# Patient Record
Sex: Female | Born: 1935 | Race: Black or African American | Smoking: Never smoker
Health system: Southern US, Community
[De-identification: ages and names within clinical notes are randomized; demographics above are authoritative.]

## PROBLEM LIST (undated history)

## (undated) DIAGNOSIS — I10 Essential (primary) hypertension: Secondary | ICD-10-CM

## (undated) DIAGNOSIS — F0390 Unspecified dementia without behavioral disturbance: Secondary | ICD-10-CM

## (undated) DIAGNOSIS — E079 Disorder of thyroid, unspecified: Secondary | ICD-10-CM

## (undated) DIAGNOSIS — E785 Hyperlipidemia, unspecified: Secondary | ICD-10-CM

## (undated) HISTORY — DX: Hyperlipidemia, unspecified: E78.5

## (undated) HISTORY — DX: Essential (primary) hypertension: I10

## (undated) HISTORY — DX: Disorder of thyroid, unspecified: E07.9

## (undated) HISTORY — DX: Unspecified dementia, unspecified severity, without behavioral disturbance, psychotic disturbance, mood disturbance, and anxiety: F03.90

---

## 2012-05-23 ENCOUNTER — Telehealth: Payer: Self-pay | Admitting: *Deleted

## 2012-05-23 ENCOUNTER — Encounter: Payer: Self-pay | Admitting: Family Medicine

## 2012-05-23 ENCOUNTER — Ambulatory Visit (INDEPENDENT_AMBULATORY_CARE_PROVIDER_SITE_OTHER): Payer: Medicare Other | Admitting: Family Medicine

## 2012-05-23 VITALS — BP 130/72 | HR 94 | Temp 98.0°F | Ht 61.5 in | Wt 141.6 lb

## 2012-05-23 DIAGNOSIS — Z1231 Encounter for screening mammogram for malignant neoplasm of breast: Secondary | ICD-10-CM

## 2012-05-23 DIAGNOSIS — E049 Nontoxic goiter, unspecified: Secondary | ICD-10-CM

## 2012-05-23 DIAGNOSIS — G4700 Insomnia, unspecified: Secondary | ICD-10-CM

## 2012-05-23 DIAGNOSIS — I10 Essential (primary) hypertension: Secondary | ICD-10-CM

## 2012-05-23 DIAGNOSIS — E119 Type 2 diabetes mellitus without complications: Secondary | ICD-10-CM

## 2012-05-23 DIAGNOSIS — E785 Hyperlipidemia, unspecified: Secondary | ICD-10-CM | POA: Insufficient documentation

## 2012-05-23 LAB — LIPID PANEL
LDL Cholesterol: 75 mg/dL (ref 0–99)
Total CHOL/HDL Ratio: 3

## 2012-05-23 LAB — CBC WITH DIFFERENTIAL/PLATELET
Basophils Absolute: 0 10*3/uL (ref 0.0–0.1)
Hemoglobin: 13.1 g/dL (ref 12.0–15.0)
Lymphocytes Relative: 30.6 % (ref 12.0–46.0)
Monocytes Relative: 7.7 % (ref 3.0–12.0)
Neutro Abs: 3.9 10*3/uL (ref 1.4–7.7)
Platelets: 240 10*3/uL (ref 150.0–400.0)
RDW: 13.4 % (ref 11.5–14.6)

## 2012-05-23 LAB — HEPATIC FUNCTION PANEL
ALT: 15 U/L (ref 0–35)
AST: 23 U/L (ref 0–37)
Alkaline Phosphatase: 57 U/L (ref 39–117)
Bilirubin, Direct: 0 mg/dL (ref 0.0–0.3)
Total Protein: 7.4 g/dL (ref 6.0–8.3)

## 2012-05-23 LAB — BASIC METABOLIC PANEL
CO2: 28 mEq/L (ref 19–32)
Chloride: 106 mEq/L (ref 96–112)
Sodium: 142 mEq/L (ref 135–145)

## 2012-05-23 MED ORDER — CETIRIZINE HCL 10 MG PO TABS: 10.0000 mg | ORAL_TABLET | Freq: Every day | ORAL | Status: AC | PRN

## 2012-05-23 MED ORDER — AMITRIPTYLINE HCL 50 MG PO TABS: 50.0000 mg | ORAL_TABLET | Freq: Every day | ORAL | Status: DC

## 2012-05-23 MED ORDER — TRAMADOL HCL 50 MG PO TABS: 50.0000 mg | ORAL_TABLET | Freq: Four times a day (QID) | ORAL | Status: DC | PRN

## 2012-05-23 MED ORDER — POLYETHYLENE GLYCOL 3350 17 G PO PACK: 17.0000 g | PACK | Freq: Every day | ORAL | Status: AC | PRN

## 2012-05-23 MED ORDER — HYDROCHLOROTHIAZIDE 25 MG PO TABS: 25.0000 mg | ORAL_TABLET | Freq: Every day | ORAL | Status: AC

## 2012-05-23 MED ORDER — METFORMIN HCL ER 500 MG PO TB24: 500.0000 mg | ORAL_TABLET | Freq: Two times a day (BID) | ORAL | Status: DC

## 2012-05-23 MED ORDER — LISINOPRIL 40 MG PO TABS: 40.0000 mg | ORAL_TABLET | Freq: Every day | ORAL | Status: AC

## 2012-05-23 MED ORDER — ZOLPIDEM TARTRATE ER 6.25 MG PO TBCR: 6.2500 mg | EXTENDED_RELEASE_TABLET | Freq: Every evening | ORAL | Status: DC | PRN

## 2012-05-23 MED ORDER — ATORVASTATIN CALCIUM 40 MG PO TABS: 40.0000 mg | ORAL_TABLET | Freq: Every day | ORAL | Status: DC

## 2012-05-23 NOTE — Patient Instructions (Signed)
Cancel your appt for later this month and schedule a follow up in 3 months to recheck diabetes We'll notify you of your lab results and make any changes if needed Someone will call you with your thyroid ultrasound and mammogram appts Call with any questions or concerns Welcome!  We're glad to have you!

## 2012-05-23 NOTE — Telephone Encounter (Signed)
Called pt to advise we did receive her medical records from her previous MD however she may need to still bring medications to make sure there is no lapse or changes between offices, pt advised they will bring medications

## 2012-05-23 NOTE — Assessment & Plan Note (Signed)
New to provider.  Chronic for pt.  Adequate control on current meds.  Asymptomatic.  No changes.

## 2012-05-23 NOTE — Assessment & Plan Note (Signed)
New to provider.  Well controlled based on labs done 3 months ago.  Recently had eye exam.  Stressed importance of yearly visits.  Denies sxs.  Will check labs and adjust meds prn.

## 2012-05-23 NOTE — Assessment & Plan Note (Signed)
New to provider.  Chronic for pt.  Was recently switched to lipitor from Zocor.  Check labs to see if LDL has improved.

## 2012-05-23 NOTE — Assessment & Plan Note (Signed)
New to provider.  Chronic for pt.  Pt reports meds are working well 'when i get them'.  Provided refills.

## 2012-05-23 NOTE — Progress Notes (Signed)
  Subjective:    Patient ID: Amber Hood, female    DOB: 11-06-1927, 78 y.o.   MRN: [id number]  HPI New to establish.  Previous MD- [facility]  DM- chronic problem, last A1C was 6.5 according to May labs.  On Metformin XR BID.  UTD on eye exam (June).  Denies symptomatic lows.  No CP, SOB, HAs, visual changes, N/V/D. + edema- was previously on fluid pill but has not been able to find them.  No weakness or numbness of hands or feet.  Hyperlipidemia- chronic problem, recently changed from simvastatin to atorvastatin in May due to LDL of 143.  Due for repeat labs.  No abd pain, N/V.  HTN- chronic problem, asymptomatic w/ exception of LE edema.  Insomnia- chronic problem for pt, taking Ambien 6.25mg  and amitriptyline whenever daughter remembers to give pt her meds.  Breast pain- R sided, mammo was ordered by previous MD in July but pt never made appt.  No breast discharge, no skin changes, no lumps.   Review of Systems For ROS see HPI     Objective:   Physical Exam  Constitutional: She is oriented to person, place, and time. She appears well-developed and well-nourished. No distress.  HENT:  Head: Normocephalic and atraumatic.  Eyes: Conjunctivae and EOM are normal. Pupils are equal, round, and reactive to light.  Neck: Normal range of motion. Neck supple. Thyromegaly (large and very firm) present.  Cardiovascular: Normal rate, regular rhythm and intact distal pulses.   Murmur (II/VI SEM heard throughout) heard. Pulmonary/Chest: Effort normal and breath sounds normal. No respiratory distress. Right breast exhibits no inverted nipple, no mass, no nipple discharge, no skin change and no tenderness. Left breast exhibits no inverted nipple, no mass, no nipple discharge, no skin change and no tenderness.  Abdominal: Soft. She exhibits no distension. There is no tenderness.  Musculoskeletal: She exhibits edema (trace edema of B LE).  Lymphadenopathy:    She has no cervical  adenopathy.  Neurological: She is alert and oriented to person, place, and time.  Skin: Skin is warm and dry.  Psychiatric: She has a normal mood and affect. Her behavior is normal.          Assessment & Plan:

## 2012-05-23 NOTE — Assessment & Plan Note (Signed)
New to provider.  Pt reports 'no one's looked at that in years'.  Will get [place] to assess as thyroid is very large and firm.

## 2012-05-24 ENCOUNTER — Encounter: Payer: Self-pay | Admitting: *Deleted

## 2012-05-26 ENCOUNTER — Other Ambulatory Visit: Payer: Medicaid Other

## 2012-05-27 ENCOUNTER — Ambulatory Visit
Admission: RE | Admit: 2012-05-27 | Discharge: 2012-05-27 | Disposition: A | Discharge status: Home / Self Care | Payer: Medicare Other | Source: Ambulatory Visit | Attending: Family Medicine | Admitting: Family Medicine

## 2012-05-27 ENCOUNTER — Telehealth: Payer: Self-pay | Admitting: *Deleted

## 2012-05-27 DIAGNOSIS — E049 Nontoxic goiter, unspecified: Secondary | ICD-10-CM

## 2012-05-27 NOTE — Telephone Encounter (Signed)
Pt daughter would like for Dr Beverely Low to review Pt records to see when Pt was Dx with dementia a specific date is needed. Pt daughter notes that this is not a urgent matter but would like to know because she is trying to get some assistant for her mom and this info is being requested.

## 2012-05-30 NOTE — Telephone Encounter (Signed)
We have dementia listed on pt's problem list from previous MD but no date of onset in the 8 pages of records I have available for review.

## 2012-05-31 ENCOUNTER — Telehealth: Payer: Self-pay | Admitting: *Deleted

## 2012-05-31 DIAGNOSIS — E010 Iodine-deficiency related diffuse (endemic) goiter: Secondary | ICD-10-CM

## 2012-05-31 NOTE — Telephone Encounter (Signed)
Discuss with patient daughter 

## 2012-05-31 NOTE — Telephone Encounter (Signed)
.  left message to have patient return my call per noted recent imaging notations made by MD Tabori as follows:  Please refer to ENT for thyroid biopsy (dx thyromegaly) Placed referral in chart will alert pt with next steps upon return of call.

## 2012-05-31 NOTE — Telephone Encounter (Signed)
Left message to call office

## 2012-05-31 NOTE — Telephone Encounter (Signed)
Pt return call,Left message to call office.  

## 2012-06-01 DIAGNOSIS — F0390 Unspecified dementia without behavioral disturbance: Secondary | ICD-10-CM

## 2012-06-01 DIAGNOSIS — M79609 Pain in unspecified limb: Secondary | ICD-10-CM

## 2012-06-01 DIAGNOSIS — E039 Hypothyroidism, unspecified: Secondary | ICD-10-CM

## 2012-06-01 DIAGNOSIS — R5383 Other fatigue: Secondary | ICD-10-CM

## 2012-06-01 DIAGNOSIS — M545 Low back pain: Secondary | ICD-10-CM

## 2012-06-01 DIAGNOSIS — R32 Unspecified urinary incontinence: Secondary | ICD-10-CM

## 2012-06-01 DIAGNOSIS — R5381 Other malaise: Secondary | ICD-10-CM

## 2012-06-01 DIAGNOSIS — Z0000 Encounter for general adult medical examination without abnormal findings: Secondary | ICD-10-CM

## 2012-06-07 NOTE — Telephone Encounter (Signed)
Spoke to pt to advise results/instructions. Pt understood, with pt DPR daughter, notes apt has been scheduled for referral and will keep that apt.

## 2012-06-09 ENCOUNTER — Ambulatory Visit: Payer: Self-pay | Admitting: Family Medicine

## 2012-06-22 ENCOUNTER — Telehealth: Payer: Self-pay | Admitting: Family Medicine

## 2012-06-22 NOTE — Telephone Encounter (Signed)
Caller: Phyllis/Other; Patient Name: Amber Hood; PCP: Sheliah Hatch.; Best Callback Phone Number: [phone].  Reports patient was found on concrete, assumed she fell. It was not witnessed, and family took her to ED at [facility]. No fractures reported.   They were instructed to follow up with Dr. Beverely Low within the next 2 weeks.   No 30 minute appointments available in next 24 hours.  Caller requests appointment  @ 15:00 or later due to her work needs.  Information noted and sent to office for review and scheduling. Caller informed.  Caller also relates that she needs a letter regarding her mom's competency and if she has dementia, with a date that dementia was noted - whether it was Dr. Beverely Low or previous provider. States she has previously called regarding this.

## 2012-06-22 NOTE — Telephone Encounter (Signed)
Noted  

## 2012-06-22 NOTE — Telephone Encounter (Signed)
Called number listed it is for [facility] Ruff-daughter ABM LM to call and set up 30-minute ov with in 2-wks per notes below.

## 2012-06-22 NOTE — Telephone Encounter (Signed)
Coming in 9.25.13 wed at 11am

## 2012-06-22 NOTE — Telephone Encounter (Signed)
Does not need appt w/in 24 hrs- instructions were listed as 2 weeks.  Please schedule hospital f/u.  Amber Hood already discussed dementia dx and lack of date of onset w/ pt's daughter (see phone note 8/16)

## 2012-07-06 ENCOUNTER — Ambulatory Visit: Payer: Medicare Other | Admitting: Family Medicine

## 2012-07-15 ENCOUNTER — Ambulatory Visit (HOSPITAL_BASED_OUTPATIENT_CLINIC_OR_DEPARTMENT_OTHER): Payer: Medicare Other

## 2012-07-18 ENCOUNTER — Ambulatory Visit (HOSPITAL_BASED_OUTPATIENT_CLINIC_OR_DEPARTMENT_OTHER): Payer: Medicare Other

## 2012-07-20 ENCOUNTER — Ambulatory Visit (HOSPITAL_BASED_OUTPATIENT_CLINIC_OR_DEPARTMENT_OTHER): Payer: Medicare Other

## 2012-07-27 ENCOUNTER — Ambulatory Visit (HOSPITAL_BASED_OUTPATIENT_CLINIC_OR_DEPARTMENT_OTHER)
Admission: RE | Admit: 2012-07-27 | Discharge: 2012-07-27 | Disposition: A | Discharge status: Home / Self Care | Payer: Medicare Other | Source: Ambulatory Visit | Attending: Family Medicine | Admitting: Family Medicine

## 2012-07-27 DIAGNOSIS — Z1231 Encounter for screening mammogram for malignant neoplasm of breast: Secondary | ICD-10-CM

## 2012-08-25 ENCOUNTER — Ambulatory Visit: Payer: Self-pay | Admitting: Family Medicine

## 2012-09-06 ENCOUNTER — Ambulatory Visit: Payer: Self-pay | Admitting: Family Medicine

## 2012-09-14 ENCOUNTER — Ambulatory Visit (INDEPENDENT_AMBULATORY_CARE_PROVIDER_SITE_OTHER): Payer: Medicare Other | Admitting: Family Medicine

## 2012-09-14 ENCOUNTER — Encounter: Payer: Self-pay | Admitting: Family Medicine

## 2012-09-14 VITALS — BP 130/90 | HR 97 | Temp 98.1°F | Ht 61.5 in | Wt 129.8 lb

## 2012-09-14 DIAGNOSIS — E119 Type 2 diabetes mellitus without complications: Secondary | ICD-10-CM

## 2012-09-14 DIAGNOSIS — E049 Nontoxic goiter, unspecified: Secondary | ICD-10-CM

## 2012-09-14 DIAGNOSIS — I10 Essential (primary) hypertension: Secondary | ICD-10-CM

## 2012-09-14 NOTE — Progress Notes (Signed)
  Subjective:    Patient ID: Amber Hood, female    DOB: [birth year], 78 y.o.   MRN: [id number]  HPI DM- chronic problem, not currently checking sugars per pt's report but caregiver reports daughter is checking them.  No log to review.  Pt is taking Metformin- daughter administers this.  Denies symptomatic lows- no dizziness or shaky feeling.  No CP, SOB, HAs, visual changes, edema.  Pt doesn't recall having eye exam this year.  HTN- chronic problem, on HCTZ and lisinoprli daily.  Asymptomatic.    Thyroid nodules- pt had US done that showed multiple nodules and last phone note indicates that pt had ENT appt.  Pt unable to report whether she went.  ** difficult hx to obtain due to dementia **   Review of Systems For ROS see HPI     Objective:   Physical Exam  Vitals reviewed. Constitutional: She appears well-developed and well-nourished. No distress.  HENT:  Head: Normocephalic and atraumatic.  Eyes: Conjunctivae normal and EOM are normal. Pupils are equal, round, and reactive to light.  Neck: Normal range of motion. Neck supple. Thyromegaly (large nodules present) present.  Cardiovascular: Normal rate, regular rhythm, normal heart sounds and intact distal pulses.   Pulmonary/Chest: Effort normal and breath sounds normal. No respiratory distress.  Abdominal: Soft. She exhibits no distension. There is no tenderness.  Musculoskeletal: She exhibits no edema.  Lymphadenopathy:    She has no cervical adenopathy.  Neurological: She is alert.       Oriented to person only  Skin: Skin is warm and dry.  Psychiatric: She has a normal mood and affect.          Assessment & Plan:

## 2012-09-14 NOTE — Patient Instructions (Addendum)
Follow up in 3 months to check sugar and cholesterol Please call and let me know if you saw the ENT doctor about your thyroid We'll notify you of your lab results and make any changes if needed Please schedule a diabetic eye exam Call with any questions or concerns Happy Holidays!

## 2012-09-15 LAB — BASIC METABOLIC PANEL
BUN: 16 mg/dL (ref 6–23)
CO2: 31 mEq/L (ref 19–32)
Calcium: 10.3 mg/dL (ref 8.4–10.5)
GFR: 76.06 mL/min (ref >60.00)
Glucose, Bld: 82 mg/dL (ref 70–99)
Sodium: 143 mEq/L (ref 135–145)

## 2012-09-16 ENCOUNTER — Ambulatory Visit: Payer: Medicare Other

## 2012-09-16 DIAGNOSIS — E049 Nontoxic goiter, unspecified: Secondary | ICD-10-CM

## 2012-09-16 LAB — T4, FREE: Free T4: 0.94 ng/dL (ref 0.60–1.60)

## 2012-09-22 ENCOUNTER — Telehealth: Payer: Self-pay | Admitting: Family Medicine

## 2012-09-22 NOTE — Telephone Encounter (Signed)
CALLED pr DAUGHTER LMOM to call & schedule labs only that fits her schedule

## 2012-09-22 NOTE — Telephone Encounter (Signed)
Message copied by Verner Chol on Thu Sep 22, 2012 10:11 AM ------      Message from: Verdie Shire      Created: Tue Sep 20, 2012  5:18 PM       Schedule 3 mos visit for labs.T3/T4 TSH. 240.9

## 2012-10-09 NOTE — Assessment & Plan Note (Signed)
Chronic problem, pt not able to reconcile meds or relay whether she has had eye exam.  Care giver reports daughter checks CBGs and administers meds.  Daughter not at appt today.  Will check labs and if med adjustments need to be made will 1st need to contact daughter to determine current regimen.

## 2012-10-09 NOTE — Assessment & Plan Note (Signed)
Chronic problem.  Adequate control.  Asymptomatic per pt report.  Check labs.  No anticipated med adjustments

## 2012-10-09 NOTE — Assessment & Plan Note (Signed)
Chronic problem.  Unclear if pt ever had ENT appt as directed at last visit.  Will attempt to contact daughter to determine where pt is in w/u.

## 2012-10-11 ENCOUNTER — Telehealth: Payer: Self-pay | Admitting: Family Medicine

## 2012-10-11 NOTE — Telephone Encounter (Signed)
Patient's daughter states the pt is out of amitriptyline and they have contacted the pharmacy twice. We have received nothing from them. Pt uses [facility] in [place].

## 2012-10-11 NOTE — Telephone Encounter (Signed)
Patient daughter requesting refill for amitriptyline. Last OV 09/12/12 last refill 05/23/12. Please advise.

## 2012-10-12 NOTE — Telephone Encounter (Signed)
Ok for #30, 3 refills 

## 2012-10-13 MED ORDER — AMITRIPTYLINE HCL 50 MG PO TABS: 50.0000 mg | ORAL_TABLET | Freq: Every day | ORAL | Status: DC

## 2012-10-13 NOTE — Telephone Encounter (Signed)
Rx sent to the pharmacy by e-script.//AB/CMA 

## 2015-06-06 ENCOUNTER — Inpatient Hospital Stay (HOSPITAL_COMMUNITY): Payer: Medicare (Managed Care)

## 2015-06-06 ENCOUNTER — Inpatient Hospital Stay (HOSPITAL_COMMUNITY): Payer: Medicare (Managed Care) | Admitting: Anesthesiology

## 2015-06-06 ENCOUNTER — Encounter (HOSPITAL_COMMUNITY): Admission: EM | Disposition: E | Payer: Self-pay | Source: Other Acute Inpatient Hospital | Attending: Vascular Surgery

## 2015-06-06 ENCOUNTER — Inpatient Hospital Stay (HOSPITAL_COMMUNITY)
Admission: EM | Admit: 2015-06-06 | Discharge: 2015-07-13 | DRG: 356 | Disposition: E | Payer: Medicare (Managed Care) | Source: Other Acute Inpatient Hospital | Attending: Vascular Surgery | Admitting: Vascular Surgery

## 2015-06-06 ENCOUNTER — Encounter (HOSPITAL_COMMUNITY): Payer: Self-pay | Admitting: *Deleted

## 2015-06-06 DIAGNOSIS — J189 Pneumonia, unspecified organism: Secondary | ICD-10-CM | POA: Insufficient documentation

## 2015-06-06 DIAGNOSIS — A4152 Sepsis due to Pseudomonas: Secondary | ICD-10-CM | POA: Diagnosis not present

## 2015-06-06 DIAGNOSIS — E11649 Type 2 diabetes mellitus with hypoglycemia without coma: Secondary | ICD-10-CM | POA: Diagnosis present

## 2015-06-06 DIAGNOSIS — R451 Restlessness and agitation: Secondary | ICD-10-CM | POA: Diagnosis not present

## 2015-06-06 DIAGNOSIS — I9581 Postprocedural hypotension: Secondary | ICD-10-CM | POA: Diagnosis not present

## 2015-06-06 DIAGNOSIS — I482 Chronic atrial fibrillation: Secondary | ICD-10-CM | POA: Diagnosis present

## 2015-06-06 DIAGNOSIS — Z9911 Dependence on respirator [ventilator] status: Secondary | ICD-10-CM | POA: Insufficient documentation

## 2015-06-06 DIAGNOSIS — Z9889 Other specified postprocedural states: Secondary | ICD-10-CM

## 2015-06-06 DIAGNOSIS — R296 Repeated falls: Secondary | ICD-10-CM | POA: Diagnosis present

## 2015-06-06 DIAGNOSIS — J9819 Other pulmonary collapse: Secondary | ICD-10-CM | POA: Diagnosis not present

## 2015-06-06 DIAGNOSIS — Z515 Encounter for palliative care: Secondary | ICD-10-CM | POA: Diagnosis not present

## 2015-06-06 DIAGNOSIS — G8918 Other acute postprocedural pain: Secondary | ICD-10-CM | POA: Diagnosis not present

## 2015-06-06 DIAGNOSIS — R7881 Bacteremia: Secondary | ICD-10-CM | POA: Diagnosis not present

## 2015-06-06 DIAGNOSIS — D696 Thrombocytopenia, unspecified: Secondary | ICD-10-CM | POA: Diagnosis not present

## 2015-06-06 DIAGNOSIS — I10 Essential (primary) hypertension: Secondary | ICD-10-CM | POA: Diagnosis present

## 2015-06-06 DIAGNOSIS — I350 Nonrheumatic aortic (valve) stenosis: Secondary | ICD-10-CM | POA: Diagnosis present

## 2015-06-06 DIAGNOSIS — Z419 Encounter for procedure for purposes other than remedying health state, unspecified: Secondary | ICD-10-CM

## 2015-06-06 DIAGNOSIS — R0902 Hypoxemia: Secondary | ICD-10-CM | POA: Diagnosis not present

## 2015-06-06 DIAGNOSIS — T17890A Other foreign object in other parts of respiratory tract causing asphyxiation, initial encounter: Secondary | ICD-10-CM | POA: Diagnosis not present

## 2015-06-06 DIAGNOSIS — K55059 Acute (reversible) ischemia of intestine, part and extent unspecified: Secondary | ICD-10-CM | POA: Diagnosis present

## 2015-06-06 DIAGNOSIS — Z9289 Personal history of other medical treatment: Secondary | ICD-10-CM

## 2015-06-06 DIAGNOSIS — I342 Nonrheumatic mitral (valve) stenosis: Secondary | ICD-10-CM | POA: Diagnosis present

## 2015-06-06 DIAGNOSIS — Z79899 Other long term (current) drug therapy: Secondary | ICD-10-CM | POA: Diagnosis not present

## 2015-06-06 DIAGNOSIS — Z7982 Long term (current) use of aspirin: Secondary | ICD-10-CM

## 2015-06-06 DIAGNOSIS — J95821 Acute postprocedural respiratory failure: Secondary | ICD-10-CM | POA: Diagnosis not present

## 2015-06-06 DIAGNOSIS — J9690 Respiratory failure, unspecified, unspecified whether with hypoxia or hypercapnia: Secondary | ICD-10-CM | POA: Insufficient documentation

## 2015-06-06 DIAGNOSIS — K559 Vascular disorder of intestine, unspecified: Secondary | ICD-10-CM

## 2015-06-06 DIAGNOSIS — G9340 Encephalopathy, unspecified: Secondary | ICD-10-CM

## 2015-06-06 DIAGNOSIS — D62 Acute posthemorrhagic anemia: Secondary | ICD-10-CM | POA: Diagnosis not present

## 2015-06-06 DIAGNOSIS — R579 Shock, unspecified: Secondary | ICD-10-CM | POA: Insufficient documentation

## 2015-06-06 DIAGNOSIS — I4891 Unspecified atrial fibrillation: Secondary | ICD-10-CM | POA: Insufficient documentation

## 2015-06-06 DIAGNOSIS — Z01811 Encounter for preprocedural respiratory examination: Secondary | ICD-10-CM

## 2015-06-06 DIAGNOSIS — F0390 Unspecified dementia without behavioral disturbance: Secondary | ICD-10-CM | POA: Diagnosis present

## 2015-06-06 DIAGNOSIS — Z66 Do not resuscitate: Secondary | ICD-10-CM | POA: Diagnosis not present

## 2015-06-06 DIAGNOSIS — E785 Hyperlipidemia, unspecified: Secondary | ICD-10-CM | POA: Diagnosis present

## 2015-06-06 DIAGNOSIS — Z4659 Encounter for fitting and adjustment of other gastrointestinal appliance and device: Secondary | ICD-10-CM

## 2015-06-06 DIAGNOSIS — I5031 Acute diastolic (congestive) heart failure: Secondary | ICD-10-CM | POA: Diagnosis not present

## 2015-06-06 DIAGNOSIS — Z01810 Encounter for preprocedural cardiovascular examination: Secondary | ICD-10-CM | POA: Diagnosis not present

## 2015-06-06 DIAGNOSIS — R578 Other shock: Secondary | ICD-10-CM | POA: Diagnosis not present

## 2015-06-06 DIAGNOSIS — E876 Hypokalemia: Secondary | ICD-10-CM | POA: Diagnosis not present

## 2015-06-06 DIAGNOSIS — A047 Enterocolitis due to Clostridium difficile: Secondary | ICD-10-CM | POA: Diagnosis not present

## 2015-06-06 DIAGNOSIS — B965 Pseudomonas (aeruginosa) (mallei) (pseudomallei) as the cause of diseases classified elsewhere: Secondary | ICD-10-CM | POA: Diagnosis not present

## 2015-06-06 DIAGNOSIS — K550 Acute vascular disorders of intestine: Principal | ICD-10-CM

## 2015-06-06 DIAGNOSIS — S301XXA Contusion of abdominal wall, initial encounter: Secondary | ICD-10-CM | POA: Diagnosis not present

## 2015-06-06 DIAGNOSIS — J9600 Acute respiratory failure, unspecified whether with hypoxia or hypercapnia: Secondary | ICD-10-CM | POA: Diagnosis not present

## 2015-06-06 DIAGNOSIS — N390 Urinary tract infection, site not specified: Secondary | ICD-10-CM | POA: Diagnosis not present

## 2015-06-06 HISTORY — PX: EMBOLECTOMY: SHX44

## 2015-06-06 LAB — COMPREHENSIVE METABOLIC PANEL
ALK PHOS: 46 U/L (ref 38–126)
ALT: 12 U/L — AB (ref 14–54)
AST: 40 U/L (ref 15–41)
Albumin: 2.3 g/dL — ABNORMAL LOW (ref 3.5–5.0)
Anion gap: 10 (ref 5–15)
BUN: 14 mg/dL (ref 6–20)
CALCIUM: 7.7 mg/dL — AB (ref 8.9–10.3)
CHLORIDE: 109 mmol/L (ref 101–111)
CO2: 19 mmol/L — ABNORMAL LOW (ref 22–32)
CREATININE: 0.75 mg/dL (ref 0.44–1.00)
GFR calc Af Amer: >60 mL/min (ref >60)
Glucose, Bld: 242 mg/dL — ABNORMAL HIGH (ref 65–99)
Potassium: 3.3 mmol/L — ABNORMAL LOW (ref 3.5–5.1)
Sodium: 138 mmol/L (ref 135–145)
Total Bilirubin: 0.9 mg/dL (ref 0.3–1.2)
Total Protein: 4.8 g/dL — ABNORMAL LOW (ref 6.5–8.1)

## 2015-06-06 LAB — POCT I-STAT 3, ART BLOOD GAS (G3+)
ACID-BASE DEFICIT: 7 mmol/L — AB (ref 0.0–2.0)
Acid-base deficit: 6 mmol/L — ABNORMAL HIGH (ref 0.0–2.0)
Acid-base deficit: 7 mmol/L — ABNORMAL HIGH (ref 0.0–2.0)
BICARBONATE: 19 meq/L — AB (ref 20.0–24.0)
BICARBONATE: 18.4 meq/L — AB (ref 20.0–24.0)
Bicarbonate: 17.8 mEq/L — ABNORMAL LOW (ref 20.0–24.0)
O2 SAT: 100 %
O2 SAT: 98 %
O2 Saturation: 96 %
PCO2 ART: 33.3 mmHg — AB (ref 35.0–45.0)
PCO2 ART: 34.2 mmHg — AB (ref 35.0–45.0)
PH ART: 7.369 (ref 7.350–7.450)
PH ART: 7.334 — AB (ref 7.350–7.450)
PH ART: 7.336 — AB (ref 7.350–7.450)
PO2 ART: 118 mmHg — AB (ref 80.0–100.0)
Patient temperature: 97.7
TCO2: 20 mmol/L (ref 0–100)
TCO2: 19 mmol/L (ref 0–100)
TCO2: 19 mmol/L (ref 0–100)
pCO2 arterial: 32.1 mmHg — ABNORMAL LOW (ref 35.0–45.0)
pO2, Arterial: 75 mmHg — ABNORMAL LOW (ref 80.0–100.0)
pO2, Arterial: 181 mmHg — ABNORMAL HIGH (ref 80.0–100.0)

## 2015-06-06 LAB — CBC
HCT: 34.5 % — ABNORMAL LOW (ref 36.0–46.0)
HEMATOCRIT: 34.4 % — AB (ref 36.0–46.0)
HEMOGLOBIN: 11.6 g/dL — AB (ref 12.0–15.0)
Hemoglobin: 11.5 g/dL — ABNORMAL LOW (ref 12.0–15.0)
MCH: 29.9 pg (ref 26.0–34.0)
MCH: 30.6 pg (ref 26.0–34.0)
MCHC: 33.3 g/dL (ref 30.0–36.0)
MCHC: 33.7 g/dL (ref 30.0–36.0)
MCV: 89.8 fL (ref 78.0–100.0)
MCV: 90.8 fL (ref 78.0–100.0)
PLATELETS: 211 10*3/uL (ref 150–400)
Platelets: 196 10*3/uL (ref 150–400)
RBC: 3.84 MIL/uL — AB (ref 3.87–5.11)
RBC: 3.79 MIL/uL — AB (ref 3.87–5.11)
RDW: 12.9 % (ref 11.5–15.5)
RDW: 13.2 % (ref 11.5–15.5)
WBC: 19.7 10*3/uL — ABNORMAL HIGH (ref 4.0–10.5)
WBC: 16.5 10*3/uL — AB (ref 4.0–10.5)

## 2015-06-06 LAB — GLUCOSE, CAPILLARY
GLUCOSE-CAPILLARY: 165 mg/dL — AB (ref 65–99)
GLUCOSE-CAPILLARY: 176 mg/dL — AB (ref 65–99)
GLUCOSE-CAPILLARY: 258 mg/dL — AB (ref 65–99)
Glucose-Capillary: 266 mg/dL — ABNORMAL HIGH (ref 65–99)
Glucose-Capillary: 200 mg/dL — ABNORMAL HIGH (ref 65–99)

## 2015-06-06 LAB — TROPONIN I: TROPONIN I: 0.07 ng/mL — AB (ref <0.031)

## 2015-06-06 LAB — PROTIME-INR
INR: 1.37 (ref 0.00–1.49)
PROTHROMBIN TIME: 17 s — AB (ref 11.6–15.2)

## 2015-06-06 LAB — MRSA PCR SCREENING: MRSA by PCR: NEGATIVE

## 2015-06-06 LAB — AMYLASE: AMYLASE: 83 U/L (ref 28–100)

## 2015-06-06 LAB — LACTIC ACID, PLASMA: LACTIC ACID, VENOUS: 3 mmol/L — AB (ref 0.5–2.0)

## 2015-06-06 LAB — ABO/RH: ABO/RH(D): O NEG

## 2015-06-06 LAB — APTT: aPTT: 29 seconds (ref 24–37)

## 2015-06-06 LAB — MAGNESIUM: MAGNESIUM: 1.1 mg/dL — AB (ref 1.7–2.4)

## 2015-06-06 LAB — TRIGLYCERIDES: Triglycerides: 46 mg/dL (ref <150)

## 2015-06-06 SURGERY — EMBOLECTOMY
Anesthesia: General | Site: Abdomen

## 2015-06-06 MED ORDER — GUAIFENESIN-DM 100-10 MG/5ML PO SYRP: 15.0000 mL | ORAL_SOLUTION | ORAL | Status: DC | PRN

## 2015-06-06 MED ORDER — PROTAMINE SULFATE 10 MG/ML IV SOLN
INTRAVENOUS | Status: DC | PRN
  Administered 2015-06-06: 50 mg via INTRAVENOUS

## 2015-06-06 MED ORDER — PROPOFOL 10 MG/ML IV BOLUS
INTRAVENOUS | Status: AC
  Filled 2015-06-06: qty 20

## 2015-06-06 MED ORDER — HEPARIN SODIUM (PORCINE) 1000 UNIT/ML IJ SOLN
INTRAMUSCULAR | Status: DC | PRN
  Administered 2015-06-06: 6000 [IU] via INTRAVENOUS

## 2015-06-06 MED ORDER — MAGNESIUM SULFATE 4 GM/100ML IV SOLN
4.0000 g | Freq: Once | INTRAVENOUS | Status: AC
  Administered 2015-06-06: 4 g via INTRAVENOUS
  Filled 2015-06-06: qty 100

## 2015-06-06 MED ORDER — ANTISEPTIC ORAL RINSE SOLUTION (CORINZ): 7.0000 mL | Freq: Four times a day (QID) | OROMUCOSAL | Status: DC

## 2015-06-06 MED ORDER — METOPROLOL TARTRATE 1 MG/ML IV SOLN
5.0000 mg | INTRAVENOUS | Status: DC | PRN
  Administered 2015-06-06 – 2015-06-16 (×10): 5 mg via INTRAVENOUS
  Filled 2015-06-06 (×11): qty 5

## 2015-06-06 MED ORDER — DEXTROSE 5 % IV SOLN
1.5000 g | Freq: Two times a day (BID) | INTRAVENOUS | Status: AC
  Administered 2015-06-06 – 2015-06-07 (×2): 1.5 g via INTRAVENOUS
  Filled 2015-06-06 (×2): qty 1.5

## 2015-06-06 MED ORDER — OXYCODONE HCL 5 MG PO TABS: 5.0000 mg | ORAL_TABLET | Freq: Once | ORAL | Status: DC | PRN

## 2015-06-06 MED ORDER — INSULIN ASPART 100 UNIT/ML ~~LOC~~ SOLN
0.0000 [IU] | SUBCUTANEOUS | Status: DC
  Administered 2015-06-06: 3 [IU] via SUBCUTANEOUS
  Administered 2015-06-06: 8 [IU] via SUBCUTANEOUS
  Administered 2015-06-06: 3 [IU] via SUBCUTANEOUS
  Administered 2015-06-06: 8 [IU] via SUBCUTANEOUS
  Administered 2015-06-07 (×2): 2 [IU] via SUBCUTANEOUS
  Administered 2015-06-07: 3 [IU] via SUBCUTANEOUS
  Administered 2015-06-08 – 2015-06-13 (×15): 2 [IU] via SUBCUTANEOUS
  Administered 2015-06-13: 3 [IU] via SUBCUTANEOUS
  Administered 2015-06-14: 2 [IU] via SUBCUTANEOUS
  Administered 2015-06-14 (×3): 3 [IU] via SUBCUTANEOUS
  Administered 2015-06-15 (×2): 2 [IU] via SUBCUTANEOUS
  Administered 2015-06-15 (×3): 3 [IU] via SUBCUTANEOUS
  Administered 2015-06-16: 5 [IU] via SUBCUTANEOUS
  Administered 2015-06-16: 3 [IU] via SUBCUTANEOUS
  Administered 2015-06-16 (×2): 5 [IU] via SUBCUTANEOUS
  Administered 2015-06-17 (×2): 2 [IU] via SUBCUTANEOUS
  Administered 2015-06-17 (×3): 3 [IU] via SUBCUTANEOUS
  Administered 2015-06-17: 2 [IU] via SUBCUTANEOUS
  Administered 2015-06-18 (×6): 3 [IU] via SUBCUTANEOUS
  Administered 2015-06-18: 5 [IU] via SUBCUTANEOUS
  Administered 2015-06-19 (×2): 3 [IU] via SUBCUTANEOUS
  Administered 2015-06-19: 2 [IU] via SUBCUTANEOUS

## 2015-06-06 MED ORDER — SODIUM CHLORIDE 0.9 % IV BOLUS (SEPSIS): 750.0000 mL | Freq: Once | INTRAVENOUS | Status: DC

## 2015-06-06 MED ORDER — MIDAZOLAM HCL 2 MG/2ML IJ SOLN
INTRAMUSCULAR | Status: DC | PRN
  Administered 2015-06-06: 2 mg via INTRAVENOUS

## 2015-06-06 MED ORDER — ALUM & MAG HYDROXIDE-SIMETH 200-200-20 MG/5ML PO SUSP: 15.0000 mL | ORAL | Status: DC | PRN

## 2015-06-06 MED ORDER — ANTISEPTIC ORAL RINSE SOLUTION (CORINZ)
7.0000 mL | Freq: Four times a day (QID) | OROMUCOSAL | Status: DC
  Administered 2015-06-06 – 2015-06-19 (×55): 7 mL via OROMUCOSAL

## 2015-06-06 MED ORDER — PROPOFOL 1000 MG/100ML IV EMUL
5.0000 ug/kg/min | INTRAVENOUS | Status: DC
  Administered 2015-06-06 – 2015-06-07 (×2): 24.876 ug/kg/min via INTRAVENOUS
  Administered 2015-06-07: 25 ug/kg/min via INTRAVENOUS
  Filled 2015-06-06 (×3): qty 100

## 2015-06-06 MED ORDER — BISACODYL 10 MG RE SUPP: 10.0000 mg | Freq: Every day | RECTAL | Status: DC | PRN

## 2015-06-06 MED ORDER — METOPROLOL TARTRATE 1 MG/ML IV SOLN
2.0000 mg | INTRAVENOUS | Status: DC | PRN
Start: 2015-06-06 — End: 2015-06-06

## 2015-06-06 MED ORDER — ONDANSETRON HCL 4 MG/2ML IJ SOLN: 4.0000 mg | Freq: Four times a day (QID) | INTRAMUSCULAR | Status: DC | PRN

## 2015-06-06 MED ORDER — PROTAMINE SULFATE 10 MG/ML IV SOLN
INTRAVENOUS | Status: AC
  Filled 2015-06-06: qty 5

## 2015-06-06 MED ORDER — POTASSIUM CHLORIDE 20 MEQ/15ML (10%) PO SOLN
40.0000 meq | Freq: Once | ORAL | Status: AC
  Administered 2015-06-06: 40 meq
  Filled 2015-06-06: qty 30

## 2015-06-06 MED ORDER — MAGNESIUM SULFATE 2 GM/50ML IV SOLN
2.0000 g | Freq: Every day | INTRAVENOUS | Status: AC | PRN
  Administered 2015-06-14: 2 g via INTRAVENOUS
  Filled 2015-06-06: qty 50

## 2015-06-06 MED ORDER — ROCURONIUM BROMIDE 100 MG/10ML IV SOLN
INTRAVENOUS | Status: DC | PRN
  Administered 2015-06-06: 50 mg via INTRAVENOUS

## 2015-06-06 MED ORDER — PROPOFOL 1000 MG/100ML IV EMUL
0.0000 ug/kg/min | INTRAVENOUS | Status: DC
  Administered 2015-06-06: 20 ug/kg/min via INTRAVENOUS

## 2015-06-06 MED ORDER — CHLORHEXIDINE GLUCONATE 0.12% ORAL RINSE (MEDLINE KIT): 15.0000 mL | Freq: Two times a day (BID) | OROMUCOSAL | Status: DC

## 2015-06-06 MED ORDER — SUCCINYLCHOLINE CHLORIDE 20 MG/ML IJ SOLN
INTRAMUSCULAR | Status: DC | PRN
  Administered 2015-06-06: 100 mg via INTRAVENOUS

## 2015-06-06 MED ORDER — INFLUENZA VAC SPLIT QUAD 0.5 ML IM SUSY
0.5000 mL | PREFILLED_SYRINGE | INTRAMUSCULAR | Status: DC
  Filled 2015-06-06: qty 0.5

## 2015-06-06 MED ORDER — ACETAMINOPHEN 650 MG RE SUPP: 325.0000 mg | RECTAL | Status: DC | PRN

## 2015-06-06 MED ORDER — FENTANYL CITRATE (PF) 250 MCG/5ML IJ SOLN
INTRAMUSCULAR | Status: AC
  Filled 2015-06-06: qty 5

## 2015-06-06 MED ORDER — POTASSIUM CHLORIDE CRYS ER 20 MEQ PO TBCR
20.0000 meq | EXTENDED_RELEASE_TABLET | Freq: Every day | ORAL | Status: AC | PRN
  Administered 2015-06-14: 40 meq via ORAL
  Filled 2015-06-06 (×2): qty 2

## 2015-06-06 MED ORDER — DILTIAZEM LOAD VIA INFUSION
10.0000 mg | Freq: Once | INTRAVENOUS | Status: DC
  Filled 2015-06-06: qty 10

## 2015-06-06 MED ORDER — PIPERACILLIN-TAZOBACTAM 3.375 G IVPB
3.3750 g | INTRAVENOUS | Status: AC
  Administered 2015-06-06: 3.375 g via INTRAVENOUS
  Filled 2015-06-06: qty 50

## 2015-06-06 MED ORDER — MIDAZOLAM HCL 2 MG/2ML IJ SOLN
INTRAMUSCULAR | Status: AC
  Filled 2015-06-06: qty 4

## 2015-06-06 MED ORDER — ACETAMINOPHEN 325 MG PO TABS
325.0000 mg | ORAL_TABLET | ORAL | Status: DC | PRN
  Administered 2015-06-14: 325 mg via ORAL
  Administered 2015-06-16 – 2015-06-17 (×3): 650 mg via ORAL
  Filled 2015-06-06 (×2): qty 2
  Filled 2015-06-06: qty 1
  Filled 2015-06-06: qty 2

## 2015-06-06 MED ORDER — SODIUM CHLORIDE 0.9 % IV SOLN
500.0000 mL | Freq: Once | INTRAVENOUS | Status: AC | PRN
  Administered 2015-06-06: 500 mL via INTRAVENOUS

## 2015-06-06 MED ORDER — PHENYLEPHRINE HCL 10 MG/ML IJ SOLN
0.0000 ug/min | INTRAVENOUS | Status: DC
  Administered 2015-06-06 – 2015-06-07 (×2): 20 ug/min via INTRAVENOUS
  Filled 2015-06-06 (×4): qty 1

## 2015-06-06 MED ORDER — SODIUM CHLORIDE 0.9 % IV BOLUS (SEPSIS)
500.0000 mL | Freq: Once | INTRAVENOUS | Status: AC
  Administered 2015-06-06: 500 mL via INTRAVENOUS

## 2015-06-06 MED ORDER — MORPHINE SULFATE (PF) 2 MG/ML IV SOLN: 2.0000 mg | INTRAVENOUS | Status: DC | PRN

## 2015-06-06 MED ORDER — SUCCINYLCHOLINE CHLORIDE 20 MG/ML IJ SOLN
INTRAMUSCULAR | Status: AC
  Filled 2015-06-06: qty 1

## 2015-06-06 MED ORDER — OXYCODONE HCL 5 MG/5ML PO SOLN: 5.0000 mg | Freq: Once | ORAL | Status: DC | PRN

## 2015-06-06 MED ORDER — SODIUM CHLORIDE 0.9 % IJ SOLN
INTRAMUSCULAR | Status: AC
  Filled 2015-06-06: qty 10

## 2015-06-06 MED ORDER — HYDRALAZINE HCL 20 MG/ML IJ SOLN: 5.0000 mg | INTRAMUSCULAR | Status: DC | PRN

## 2015-06-06 MED ORDER — PHENOL 1.4 % MT LIQD: 1.0000 | OROMUCOSAL | Status: DC | PRN

## 2015-06-06 MED ORDER — FENTANYL CITRATE (PF) 100 MCG/2ML IJ SOLN
50.0000 ug | INTRAMUSCULAR | Status: DC | PRN
  Filled 2015-06-06: qty 2

## 2015-06-06 MED ORDER — SODIUM CHLORIDE 0.9 % IV SOLN
INTRAVENOUS | Status: DC
  Administered 2015-06-09: 17:00:00 via INTRAVENOUS

## 2015-06-06 MED ORDER — FENTANYL CITRATE (PF) 100 MCG/2ML IJ SOLN
50.0000 ug | INTRAMUSCULAR | Status: DC | PRN
  Administered 2015-06-06: 50 ug via INTRAVENOUS
  Filled 2015-06-06: qty 2

## 2015-06-06 MED ORDER — KCL IN DEXTROSE-NACL 20-5-0.45 MEQ/L-%-% IV SOLN
INTRAVENOUS | Status: DC
  Administered 2015-06-06 – 2015-06-10 (×9): via INTRAVENOUS
  Administered 2015-06-10: 1000 mL via INTRAVENOUS
  Filled 2015-06-06 (×13): qty 1000

## 2015-06-06 MED ORDER — SODIUM CHLORIDE 0.9 % IV SOLN
10.0000 mg | INTRAVENOUS | Status: DC | PRN
  Administered 2015-06-06: 50 ug/min via INTRAVENOUS

## 2015-06-06 MED ORDER — LIDOCAINE HCL (CARDIAC) 20 MG/ML IV SOLN
INTRAVENOUS | Status: DC | PRN
  Administered 2015-06-06: 100 mg via INTRAVENOUS

## 2015-06-06 MED ORDER — CHLORHEXIDINE GLUCONATE 0.12% ORAL RINSE (MEDLINE KIT)
15.0000 mL | Freq: Two times a day (BID) | OROMUCOSAL | Status: DC
  Administered 2015-06-06 – 2015-06-19 (×27): 15 mL via OROMUCOSAL

## 2015-06-06 MED ORDER — ROCURONIUM BROMIDE 50 MG/5ML IV SOLN
INTRAVENOUS | Status: AC
  Filled 2015-06-06: qty 1

## 2015-06-06 MED ORDER — FENTANYL CITRATE (PF) 100 MCG/2ML IJ SOLN
50.0000 ug | INTRAMUSCULAR | Status: DC | PRN
  Administered 2015-06-06 – 2015-06-07 (×4): 50 ug via INTRAVENOUS
  Filled 2015-06-06 (×6): qty 2

## 2015-06-06 MED ORDER — PROPOFOL 1000 MG/100ML IV EMUL
INTRAVENOUS | Status: AC
  Filled 2015-06-06: qty 100

## 2015-06-06 MED ORDER — DOCUSATE SODIUM 100 MG PO CAPS: 100.0000 mg | ORAL_CAPSULE | Freq: Every day | ORAL | Status: DC

## 2015-06-06 MED ORDER — SODIUM CHLORIDE 0.9 % IR SOLN
Status: DC | PRN
  Administered 2015-06-06: 500 mL

## 2015-06-06 MED ORDER — 0.9 % SODIUM CHLORIDE (POUR BTL) OPTIME
TOPICAL | Status: DC | PRN
Start: 2015-06-06 — End: 2015-06-06
  Administered 2015-06-06: 2000 mL

## 2015-06-06 MED ORDER — MEPERIDINE HCL 25 MG/ML IJ SOLN: 6.2500 mg | INTRAMUSCULAR | Status: DC | PRN

## 2015-06-06 MED ORDER — PROPOFOL 1000 MG/100ML IV EMUL: 0.0000 ug/kg/min | INTRAVENOUS | Status: DC

## 2015-06-06 MED ORDER — LACTATED RINGERS IV SOLN
INTRAVENOUS | Status: DC | PRN
  Administered 2015-06-06: 02:00:00 via INTRAVENOUS

## 2015-06-06 MED ORDER — SODIUM CHLORIDE 0.9 % IV SOLN
INTRAVENOUS | Status: DC | PRN
  Administered 2015-06-06: 02:00:00 via INTRAVENOUS

## 2015-06-06 MED ORDER — HYDROMORPHONE HCL 1 MG/ML IJ SOLN: 0.2500 mg | INTRAMUSCULAR | Status: DC | PRN

## 2015-06-06 MED ORDER — LIDOCAINE HCL (CARDIAC) 20 MG/ML IV SOLN
INTRAVENOUS | Status: AC
  Filled 2015-06-06: qty 5

## 2015-06-06 MED ORDER — DILTIAZEM HCL 100 MG IV SOLR
5.0000 mg/h | INTRAVENOUS | Status: DC
  Filled 2015-06-06: qty 100

## 2015-06-06 MED ORDER — VANCOMYCIN HCL IN DEXTROSE 1-5 GM/200ML-% IV SOLN
INTRAVENOUS | Status: AC
  Administered 2015-06-06: 1000 mg via INTRAVENOUS
  Filled 2015-06-06: qty 200

## 2015-06-06 MED ORDER — PANTOPRAZOLE SODIUM 40 MG IV SOLR
40.0000 mg | Freq: Every day | INTRAVENOUS | Status: DC
  Administered 2015-06-06 – 2015-06-14 (×9): 40 mg via INTRAVENOUS
  Filled 2015-06-06 (×13): qty 40

## 2015-06-06 MED ORDER — PROPOFOL 10 MG/ML IV BOLUS
INTRAVENOUS | Status: DC | PRN
  Administered 2015-06-06: 120 mg via INTRAVENOUS

## 2015-06-06 MED ORDER — PANTOPRAZOLE SODIUM 40 MG PO TBEC: 40.0000 mg | DELAYED_RELEASE_TABLET | Freq: Every day | ORAL | Status: DC

## 2015-06-06 MED ORDER — FENTANYL CITRATE (PF) 250 MCG/5ML IJ SOLN
INTRAMUSCULAR | Status: DC | PRN
  Administered 2015-06-06: 100 ug via INTRAVENOUS

## 2015-06-06 MED ORDER — LABETALOL HCL 5 MG/ML IV SOLN: 10.0000 mg | INTRAVENOUS | Status: DC | PRN

## 2015-06-06 SURGICAL SUPPLY — 48 items
CANISTER SUCTION 2500CC (MISCELLANEOUS) ×4 IMPLANT
CLIP TI MEDIUM 24 (CLIP) ×4 IMPLANT
CLIP TI WIDE RED SMALL 24 (CLIP) ×4 IMPLANT
DRSG COVADERM 4X14 (GAUZE/BANDAGES/DRESSINGS) ×4 IMPLANT
ELECT BLADE 4.0 EZ CLEAN MEGAD (MISCELLANEOUS) ×4
ELECT BLADE 6.5 EXT (BLADE) IMPLANT
ELECT REM PT RETURN 9FT ADLT (ELECTROSURGICAL) ×4
ELECTRODE BLDE 4.0 EZ CLN MEGD (MISCELLANEOUS) ×2 IMPLANT
ELECTRODE REM PT RTRN 9FT ADLT (ELECTROSURGICAL) ×2 IMPLANT
GLOVE BIOGEL PI IND STRL 6.5 (GLOVE) ×4 IMPLANT
GLOVE BIOGEL PI IND STRL 7.5 (GLOVE) ×4 IMPLANT
GLOVE BIOGEL PI INDICATOR 6.5 (GLOVE) ×4
GLOVE BIOGEL PI INDICATOR 7.5 (GLOVE) ×4
GLOVE ECLIPSE 7.0 STRL STRAW (GLOVE) ×4 IMPLANT
GLOVE SS BIOGEL STRL SZ 7 (GLOVE) ×2 IMPLANT
GLOVE SUPERSENSE BIOGEL SZ 7 (GLOVE) ×2
GLOVE SURG SS PI 7.5 STRL IVOR (GLOVE) ×4 IMPLANT
GOWN STRL REUS W/ TWL LRG LVL3 (GOWN DISPOSABLE) ×6 IMPLANT
GOWN STRL REUS W/TWL LRG LVL3 (GOWN DISPOSABLE) ×6
INSERT FOGARTY 61MM (MISCELLANEOUS) ×4 IMPLANT
INSERT FOGARTY SM (MISCELLANEOUS) ×8 IMPLANT
KIT BASIN OR (CUSTOM PROCEDURE TRAY) ×4 IMPLANT
KIT ROOM TURNOVER OR (KITS) ×4 IMPLANT
LOOP VESSEL MAXI BLUE (MISCELLANEOUS) IMPLANT
LOOP VESSEL MINI RED (MISCELLANEOUS) IMPLANT
NS IRRIG 1000ML POUR BTL (IV SOLUTION) ×8 IMPLANT
PACK AORTA (CUSTOM PROCEDURE TRAY) ×4 IMPLANT
PAD ARMBOARD 7.5X6 YLW CONV (MISCELLANEOUS) ×8 IMPLANT
SPONGE LAP 18X18 X RAY DECT (DISPOSABLE) IMPLANT
SPONGE LAP 4X18 X RAY DECT (DISPOSABLE) ×4 IMPLANT
STAPLER VISISTAT 35W (STAPLE) ×8 IMPLANT
SUT ETHIBOND 5 LR DA (SUTURE) IMPLANT
SUT PROLENE 1 XLH 60 (SUTURE) ×8 IMPLANT
SUT PROLENE 3 0 SH1 36 (SUTURE) IMPLANT
SUT PROLENE 4 0 RB 1 (SUTURE)
SUT PROLENE 4-0 RB1 .5 CRCL 36 (SUTURE) IMPLANT
SUT PROLENE 5 0 CC 1 (SUTURE) IMPLANT
SUT PROLENE 5 0 CC1 (SUTURE) IMPLANT
SUT PROLENE 6 0 C 1 24 (SUTURE) ×4 IMPLANT
SUT PROLENE 6 0 C 1 30 (SUTURE) ×8 IMPLANT
SUT SILK 2 0 SH CR/8 (SUTURE) IMPLANT
SUT SILK 3 0 SH CR/8 (SUTURE) IMPLANT
SUT VIC AB 2-0 CTX 36 (SUTURE) ×4 IMPLANT
SUT VIC AB 3-0 MH 27 (SUTURE) ×8 IMPLANT
SUT VIC AB 3-0 SH 27 (SUTURE) ×2
SUT VIC AB 3-0 SH 27X BRD (SUTURE) ×2 IMPLANT
TRAY FOLEY W/METER SILVER 16FR (SET/KITS/TRAYS/PACK) ×4 IMPLANT
WATER STERILE IRR 1000ML POUR (IV SOLUTION) ×4 IMPLANT

## 2015-06-06 NOTE — Op Note (Signed)
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 Radial arterial line and central line were placed by anesthesia. Abdomen and groins were prepped Betadine scrub and solution draped in routine sterile manner. Midline incision was made from xiphoid to pubis carried down through subcutaneous tissue. There had been a previous midline lower abdominal incision. There were adhesions of between the greater omentum and anterior abdominal wall in the pelvis and these were lysed with the Metzenbaum scissors. Perineal cavity was entered and thoroughly explored. Liver was smooth. Gallbladder been previously removed. Stomach was unremarkable. The colon and small intestines were carefully examined. Colon had no evidence of ischemia. The proximal intestines were slightly dusky but there was no evidence of any gangrene area of the distal small intestine down to the ileocecal area was more dusky and there was some hemorrhage in the root of the mesentery. There were no areas that were suspicious for infarction. Following this the colon was elevated and intestines reflect the right side and the aorta was examined manually and had Pulse periods appear mesenteric artery was then examined and the root of the mesentery where it was completely pulseless. It was dissected free for a distance of about 3-4 cm and circled with vessel loop. Patient was then heparinized and a transverse opening made in the  SMA. It was filled with thrombotic material. Some of this was expelled spontaneously. A 4 Fogarty catheter was passed proximally into the aorta and upon return more thromboembolic material was retrieved followed by excellent inflow. There was some backbleeding. Fogarty was then passed distally and on 3 different occasions thromboembolic material was retrieved from the distal SMA followed by excellent back bleeding. Multiple additional passes with the Fogarty both a 3 and a 4 revealed no further thrombus in the distal SMA. It was a diffusely diseased vessel but there was no plaque where the transverse arthrotomy had been performed. Arthrotomy was closed with 2 continuous 60 proline sutures from the corners and following appropriate flushing was completed Vesseloops released there was excellent pulse and Doppler flow in the SMA. Intestines were carefully examined. Dr. Shelda Altes general surgeon will also inspected the intestines and we both were comfortable that there was no evidence of impending gangrene or infarction in the dusky areas had improved significantly when the SMA was opened. Protamine was given to reverse the heparin. Following adequate hemostasis the perineal cavity was rugated with saline linea alba closed #1 proline skin with staple sterile dressing applied patient taken to surgical intensive care unit in stable condition on the ventilator. She was stable hemodynamically throughout the case. Had adequate urinary output and blood loss was proximally 400 cc. Josephina Gip, MD 05/30/2015 3:53 AM

## 2015-06-06 NOTE — Progress Notes (Addendum)
Brief narrative.  81 yof w/ h/o dementia, CAF (not on coumadin d/t fall risk), who is s/p Emergent OR for mesenteric ischemia and underwent emergent thrombolectomy of SMA> on arrival was hypotensive and felt to be d/t volume depletion. She was volume resuscitated, neo was weaned to off and she later started on weaning efforts. I had re-rounded this afternoon to check on her progress and assess readiness for extubation. On arrival she was on a PSV of 10 cmH20 but tachycardic w/ HR 140-150s, BP in 80s, and lethargic. Placed her back on full support; checked CVP which was now 10 cmH2O. Based on these findings had hoped that weaning may have contributed to the AF , now w/ RVR and that sedation may have contributed to hypotension. We also sent stat CBC and chemistry.  We turned off the diprivan gtt, added back low dose Neo, and gave her some time to settle down. In about 30 minutes. Her BP was better, but she remained tachycardic w/ labile HR bouncing from 120s-130s.   BP 95/60 mmHg  Pulse 109  Temp(Src) 98.1 F (36.7 C) (Oral)  Resp 15  Ht 5' 1.42" (1.56 m)  Wt 67 kg (147 lb 11.3 oz)  BMI 27.53 kg/m2  SpO2 100%  CVP: 10 mmHg  . dextrose 5 % and 0.45 % NaCl with KCl 20 mEq/L 100 mL/hr at 06/12/2015 1450  . phenylephrine (NEO-SYNEPHRINE) Adult infusion 30 mcg/min (05/13/2015 1447)  . propofol (DIPRIVAN) infusion 5 mcg/kg/min (05/14/2015 1300)    Intake/Output Summary (Last 24 hours) at 05/19/2015 1501 Last data filed at 05/14/2015 1300  Gross per 24 hour  Intake 4363.88 ml  Output   1465 ml  Net 2898.88 ml    General: Frail, elderly AAF sedated on vent Neuro: Sedated on vent, opens eyes to command HEENT: No jvd/lan Cardiovascular: HSIR Afib Lungs: Decreased bs bases Abdomen: Dressing CDI Musculoskeletal: Intact Skin: cool and dry   Recent Labs Lab 06/08/2015 0430  NA 138  K 3.3*  CL 109  CO2 19*  BUN 14  CREATININE 0.75  GLUCOSE 242*    Recent Labs Lab 05/28/2015 0430  05/17/2015 1410  HGB 11.5* 11.6*  HCT 34.5* 34.4*  WBC 19.7* 16.5*  PLT 211 196    Recent Labs Lab 06/11/2015 0430 05/28/2015 1100 05/16/2015 1410  WBC 19.7*  --  16.5*  LATICACIDVEN  --  3.0*  --    ABG    Component Value Date/Time   PHART 7.334* 05/16/2015 0950   PCO2ART 33.3* 05/26/2015 0950   PO2ART 181.0* 05/31/2015 0950   HCO3 17.8* 05/30/2015 0950   TCO2 19 06/05/2015 0950   ACIDBASEDEF 7.0* 05/19/2015 0950   O2SAT 100.0 05/18/2015 0950   Impression/plan  Ventilator dependence: failing weaning from a hemodynamic stand-point. Vts good on PSV but lethargic and hypotensive.  Plan Resume full vent support F/u am abg Not ready for weaning  AF w/RVR.  Has h/o AF. Now w/ HR in 140s while weaning. This may simply be d/t the stress of weaning superimposed on an 81 year old after a big surgery.  Plan As above changed her to full vent support Will give PRN lopressor for HR >120  Adjust neo to maintain MAP >65  Hypotension/ systemic shock.  Not sure if this is sedation related OR d/t her RVR. Her lactic acid was elevated & she meets SIRS criteria so septic shock on d-dx, possibly transient bowel translocation ??? Her CVP is 10 so she is adequately volume resuscitated  and won't benefit further from repeated fluid challenge.  Plan Cont post-op abx D/c diprivan Cont Neo Add low dose lopressor for HR issues.   I spent an total of 75 minutes w/ assessing pt, resuming full support ventilation, adjusting hemodynamic gtt medication regimen and f/u with and assessment of lab work 1350-1505   Simonne Martinet ACNP-BC [facility] Pulmonary/Critical Care Pager # [phone] OR # [phone] if no answer

## 2015-06-06 NOTE — Progress Notes (Signed)
Removed pink tape and secured ETT with commercial tube holder.

## 2015-06-06 NOTE — H&P (Signed)
Vascular Surgery H&P  Chief Complaint: Abdominal pain since 4 PM  HPI: Amber Hood is a 81 y.o. female who presents for evaluation of abdominal pain. Patient was evaluated at [facility] for acute onset of abdominal pain at approximately 4 PM. 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 Patient has a long history of intermittent atrial fibrillation. She is not on chronic anticoagulation. She was transferred here for attempted thrombectomy of superior mesenteric artery. She has had no previous emboli. She has had previous abdominal procedures.   Past Medical History  Diagnosis Date  . Diabetes mellitus   . Hypertension   . Hyperlipidemia   . Thyroid disease   . Dementia    No past surgical history on file. Social History   Social History  . Marital Status: Widowed    Spouse Name: N/A  . Number of Children: N/A  . Years of Education: N/A   Social History Main Topics  . Smoking status: Never Smoker   . Smokeless tobacco: Not on file  . Alcohol Use: No  . Drug Use: No  . Sexual Activity: Not on file   Other Topics Concern  . Not on file   Social History Narrative  . No narrative on file   No family history on file. No Known Allergies Prior to Admission medications   Medication Sig Start Date End Date Taking? Authorizing Provider  amitriptyline (ELAVIL) 50 MG tablet Take 1 tablet (50 mg total) by mouth at bedtime. 10/13/12   Sheliah Hatch, MD  aspirin 81 MG tablet Take 81 mg by mouth daily.    Historical Provider, MD  atorvastatin (LIPITOR) 40 MG tablet Take 1 tablet (40 mg total) by mouth daily. 05/23/12   Sheliah Hatch, MD  cetirizine (ZYRTEC) 10 MG tablet Take 1 tablet (10 mg total) by mouth daily as needed. 05/23/12   Sheliah Hatch, MD  hydrochlorothiazide (HYDRODIURIL) 25 MG tablet Take 1 tablet (25 mg total) by mouth daily. 05/23/12   Sheliah Hatch, MD  lisinopril (PRINIVIL,ZESTRIL) 40 MG tablet Take 1 tablet  (40 mg total) by mouth daily. 05/23/12   Sheliah Hatch, MD  metFORMIN (GLUCOPHAGE-XR) 500 MG 24 hr tablet Take 1 tablet (500 mg total) by mouth 2 (two) times daily. 05/23/12   Sheliah Hatch, MD  polyethylene glycol [facility] / Ethelene Hal) packet Take 17 g by mouth daily as needed. 05/23/12   Sheliah Hatch, MD  traMADol (ULTRAM) 50 MG tablet Take 1 tablet (50 mg total) by mouth every 6 (six) hours as needed. 05/23/12   Sheliah Hatch, MD  zolpidem (AMBIEN CR) 6.25 MG CR tablet Take 1 tablet (6.25 mg total) by mouth at bedtime as needed. 05/23/12   Sheliah Hatch, MD     Positive ROS: Denies chest pain, dyspnea on exertion, PND, orthopnea.  All other systems have been reviewed and were otherwise negative with the exception of those mentioned in the HPI and as above.  Physical Exam: There were no vitals filed for this visit.  General: Alert, no acute distress-elderly-complaining of abdominal pain HEENT: Normal for age Cardiovascular: Regular rate and rhythm. Carotid pulses 2+, no bruits audible Respiratory: Clear to auscultation. No cyanosis, no use of accessory musculature GI: No organomegaly, diffuse guarding. No rebound tenderness. Skin: No lesions in the area of chief complaint Neurologic: Sensation intact distally Psychiatric: Patient is competent for consent with normal mood and affect Musculoskeletal: No obvious deformities Extremities: 2+ femoral pulses palpable  bilaterally. No pedal or posterior tibial pulses palpable. Both feet pink and well perfused.    Imaging reviewed: CT angiogram of abdomen and pelvis reviewed with radiologist. Patient does have acute occlusion of SMA about 1-2 cm from origin. Does not appear to have severe atherosclerosis and SMA. Celiac axis is patent. Aorta is widely patent. There is thickening of small bowel loops but no free air noted.   Assessment/Plan:  Mesenteric ischemia-likely due to acute thrombosis SMA by embolus due to atrial  fib Plan immediate exploratory laparotomy with attempted thrombectomy of SMA. We'll also have general surgical input regarding ischemic bowel Discussed this at length with patient's daughters and patient and they understand and agree with immediate surgery   Josephina Gip, MD Jun 21, 2015 1:35 AM

## 2015-06-06 NOTE — Transfer of Care (Signed)
Immediate Anesthesia Transfer of Care Note  Patient: Amber Hood  Procedure(s) Performed: Procedure(s): Exploratory Laparotomy; Thromboembolectomy of Superior Mesenteric Artery (N/A)  Patient Location: SICU  Anesthesia Type:General  Level of Consciousness: Patient remains intubated per anesthesia plan  Airway & Oxygen Therapy: Patient remains intubated per anesthesia plan and Patient placed on Ventilator (see vital sign flow sheet for setting)  Post-op Assessment: Report given to RN and Post -op Vital signs reviewed and stable  Post vital signs: Reviewed and stable  Last Vitals: There were no vitals filed for this visit.  Complications: No apparent anesthesia complications

## 2015-06-06 NOTE — Progress Notes (Signed)
Transported pt from 2S05 to 2S12 due to needing that room for another pt. Pt's BP low, HR tachycardic throughout transport, CCM NP aware and following pt at this time. RT will continue to monitor.

## 2015-06-06 NOTE — Progress Notes (Signed)
eLink Physician-Brief Progress Note Patient Name: Amber Hood DOB: 1928-06-13 MRN: [id number]   Date of Service  06/28/15  HPI/Events of Note  Patient s/p surgery for vascular ischemia.  Remains vented.  eICU Interventions  Plan: Vent and sedation orders  To be seen by PCCM     Intervention Category Major Interventions: Other:  Shonteria Abeln 2015-06-28, 5:43 AM

## 2015-06-06 NOTE — Progress Notes (Signed)
Utilization Review Completed.  

## 2015-06-06 NOTE — Anesthesia Procedure Notes (Addendum)
Procedure Name: Intubation Date/Time: 05/25/2015 1:58 AM Performed by: Molli Hazard Pre-anesthesia Checklist: Patient identified, Emergency Drugs available, Suction available and Patient being monitored Patient Re-evaluated:Patient Re-evaluated prior to inductionOxygen Delivery Method: Circle system utilized Preoxygenation: Pre-oxygenation with 100% oxygen Intubation Type: IV induction and Rapid sequence Laryngoscope Size: Miller and 2 Grade View: Grade I Tube type: Subglottic suction tube Tube size: 7.0 mm Number of attempts: 1 Airway Equipment and Method: Stylet Placement Confirmation: ETT inserted through vocal cords under direct vision,  positive ETCO2 and breath sounds checked- equal and bilateral Secured at: 23 cm Tube secured with: Tape Dental Injury: Teeth and Oropharynx as per pre-operative assessment       Right IJ vein with needle in place for CVC catheter.

## 2015-06-06 NOTE — Anesthesia Preprocedure Evaluation (Addendum)
Anesthesia Evaluation  Patient identified by MRN, date of birth, ID band Patient awake    Reviewed: Allergy & Precautions, NPO status , Patient's Chart, lab work & pertinent test results, Unable to perform ROS - Chart review only  Airway Mallampati: II  TM Distance: >3 FB Neck ROM: Full    Dental  (+) Missing, Poor Dentition, Dental Advisory Given Multiple teeth missing on top, front.:   Pulmonary  breath sounds clear to auscultation        Cardiovascular hypertension, Pt. on medications + Peripheral Vascular Disease Rhythm:Irregular Rate:Tachycardia     Neuro/Psych    GI/Hepatic   Endo/Other  diabetes, Well Controlled, Type 2, Oral Hypoglycemic Agents  Renal/GU      Musculoskeletal   Abdominal   Peds  Hematology   Anesthesia Other Findings   Reproductive/Obstetrics                            Anesthesia Physical Anesthesia Plan  ASA: IV and emergent  Anesthesia Plan: General   Post-op Pain Management:    Induction: Intravenous, Rapid sequence and Cricoid pressure planned  Airway Management Planned: Oral ETT  Additional Equipment: Arterial line and CVP  Intra-op Plan:   Post-operative Plan: Post-operative intubation/ventilation  Informed Consent: I have reviewed the patients History and Physical, chart, labs and discussed the procedure including the risks, benefits and alternatives for the proposed anesthesia with the patient or authorized representative who has indicated his/her understanding and acceptance.   Dental advisory given  Plan Discussed with: Anesthesiologist, Surgeon and CRNA  Anesthesia Plan Comments:        Anesthesia Quick Evaluation

## 2015-06-06 NOTE — Plan of Care (Signed)
Dr. Molli Knock notified of pt's agitation. Orders received.

## 2015-06-06 NOTE — Consult Note (Signed)
PULMONARY / CRITICAL CARE MEDICINE   Name: Amber Hood MRN: [id number] DOB: [birth year]    ADMISSION DATE:  Jun 11, 2015 CONSULTATION DATE: 8/25  REFERRING MD :  Hart Rochester  CHIEF COMPLAINT:  Abd pain  INITIAL PRESENTATION: To [facility] with abd pain  STUDIES:    SIGNIFICANT EVENTS: 8/25 p lap mesenteric thrombus   HISTORY OF PRESENT ILLNESS:   81 yo AAM with history of dementia/falls, Afib but not on anticoagulation due to falls, who presented to [facility] 8/24 with acute abd pain. She was transferred to [facility] and to Care of Vascular surgery for exp lap with superior mesenteric thrombectomy. She returned from OR to SICU and PCCM asked to assist in post op management.  PAST MEDICAL HISTORY :   has a past medical history of Diabetes mellitus; Hypertension; Hyperlipidemia; Thyroid disease; and Dementia.  has no past surgical history on file. Prior to Admission medications   Medication Sig Start Date End Date Taking? Authorizing Provider  amitriptyline (ELAVIL) 50 MG tablet Take 1 tablet (50 mg total) by mouth at bedtime. 10/13/12   Sheliah Hatch, MD  aspirin 81 MG tablet Take 81 mg by mouth daily.    Historical Provider, MD  atorvastatin (LIPITOR) 40 MG tablet Take 1 tablet (40 mg total) by mouth daily. 05/23/12   Sheliah Hatch, MD  cetirizine (ZYRTEC) 10 MG tablet Take 1 tablet (10 mg total) by mouth daily as needed. 05/23/12   Sheliah Hatch, MD  hydrochlorothiazide (HYDRODIURIL) 25 MG tablet Take 1 tablet (25 mg total) by mouth daily. 05/23/12   Sheliah Hatch, MD  lisinopril (PRINIVIL,ZESTRIL) 40 MG tablet Take 1 tablet (40 mg total) by mouth daily. 05/23/12   Sheliah Hatch, MD  metFORMIN (GLUCOPHAGE-XR) 500 MG 24 hr tablet Take 1 tablet (500 mg total) by mouth 2 (two) times daily. 05/23/12   Sheliah Hatch, MD  polyethylene glycol [facility] Ethelene Hal) packet Take 17 g by mouth daily as needed. 05/23/12   Sheliah Hatch, MD  traMADol (ULTRAM) 50 MG tablet Take 1 tablet  (50 mg total) by mouth every 6 (six) hours as needed. 05/23/12   Sheliah Hatch, MD  zolpidem (AMBIEN CR) 6.25 MG CR tablet Take 1 tablet (6.25 mg total) by mouth at bedtime as needed. 05/23/12   Sheliah Hatch, MD   No Known Allergies  FAMILY HISTORY:  has no family status information on file.  SOCIAL HISTORY:  reports that she has never smoked. She does not have any smokeless tobacco history on file. She reports that she does not drink alcohol or use illicit drugs.  REVIEW OF SYSTEMS:  NA  SUBJECTIVE:   VITAL SIGNS: Temp:  [93.4 F (34.1 C)-97.7 F (36.5 C)] 97.7 F (36.5 C) (08/25 0800) Pulse Rate:  [95-96] 95 (08/25 0700) Resp:  [12-29] 16 (08/25 0700) BP: (94-124)/(63-72) 124/72 mmHg (08/25 0700) SpO2:  [100 %] 100 % (08/25 0700) Weight:  [147 lb 11.3 oz (67 kg)] 147 lb 11.3 oz (67 kg) (08/25 0800) HEMODYNAMICS:   VENTILATOR SETTINGS:   INTAKE / OUTPUT:  Intake/Output Summary (Last 24 hours) at 2015-06-11 4098 Last data filed at June 11, 2015 0500  Gross per 24 hour  Intake   2530 ml  Output   1260 ml  Net   1270 ml    PHYSICAL EXAMINATION: General:  Frail, elderly AAF sedated on vent Neuro: Sedated on vent, opens eyes to command HEENT:  No jvd/lan Cardiovascular:  HSIR Afib Lungs: Decreased bs bases Abdomen: Dressing  CDI Musculoskeletal: Intact Skin: cool and dry  LABS:  CBC  Recent Labs Lab 06/07/2015 0430  WBC 19.7*  HGB 11.5*  HCT 34.5*  PLT 211   Coag's  Recent Labs Lab 06/05/2015 0430  APTT 29  INR 1.37   BMET  Recent Labs Lab 05/31/2015 0430  NA 138  K 3.3*  CL 109  CO2 19*  BUN 14  CREATININE 0.75  GLUCOSE 242*   Electrolytes  Recent Labs Lab 05/29/2015 0430  CALCIUM 7.7*  MG 1.1*   Sepsis Markers No results for input(s): LATICACIDVEN, PROCALCITON, O2SATVEN in the last 168 hours. ABG No results for input(s): PHART, PCO2ART, PO2ART in the last 168 hours. Liver Enzymes  Recent Labs Lab 05/20/2015 0430  AST 40  ALT 12*   ALKPHOS 46  BILITOT 0.9  ALBUMIN 2.3*   Cardiac Enzymes No results for input(s): TROPONINI, PROBNP in the last 168 hours. Glucose No results for input(s): GLUCAP in the last 168 hours.  Imaging Dg Chest Port 1 View  06/05/2015   CLINICAL DATA:  Intubation.  Prior surgery.  EXAM: PORTABLE CHEST - 1 VIEW  COMPARISON:  05/14/2015.  FINDINGS: Endotracheal tube tip 4 cm above the carina. Mediastinum and hilar structures are stable. Heart size stable. Low lung volumes with bilateral subsegmental atelectasis. No pleural effusion or pneumothorax. Degenerative changes thoracic spine. Surgical staples noted over the abdomen. Surgical clips right upper quadrant.  IMPRESSION: 1. Endotracheal tube tip 4 cm above the carina. 2. Low lung volumes with mild bilateral subsegmental atelectasis .   Electronically Signed   By: Maisie Fus  Register   On: 06/04/2015 07:04   Dg Chest Port 1 View  05/28/2015   CLINICAL DATA:  Preop.  Ischemic bowel.  EXAM: PORTABLE CHEST - 1 VIEW  COMPARISON:  None.  FINDINGS: Shallow inspiration. Borderline heart size and pulmonary vascularity, likely normal for technique. No focal consolidation in the lungs. Calcified and tortuous aorta. No blunting of costophrenic angles. No pneumothorax. Degenerative changes in the spine.  IMPRESSION: No active disease.   Electronically Signed   By: Burman Nieves M.D.   On: 05/23/2015 02:14   Dg Abd Portable 1v  05/19/2015   CLINICAL DATA:  Laparoscopic cholecystectomy.  EXAM: PORTABLE ABDOMEN - 1 VIEW  COMPARISON:  CT 06/05/2015.  FINDINGS: NG tube noted with tip projected over the stomach. Surgical clips right upper quadrant. Surgical staples over the mid abdomen. No bowel distention or free air. Degenerative changes scoliosis lumbar spine. Pelvic phleboliths. Degenerative changes both hips.  IMPRESSION: 1.  NG tube noted with tip projected over the stomach.  2. Surgical staples over the abdomen. No bowel distention. No acute abnormality identified.    Electronically Signed   By: Maisie Fus  Register   On: 06/05/2015 07:07     ASSESSMENT / PLAN:  PULMONARY OETT 8/25>> A: VDRF post lap P:   Vent bundle Wean per vascular request and if meets criteria  Check abg  CARDIOVASCULAR CVL 8/25 rt i j >> A:  Post op hypotension Hx of HTN P:  Fluid challenge  CVP monitoring Pressors as needed(use neo with afib and rvr) May need to dc diprivan and use intermittent versed/fentanyl   RENAL A:  Hypokalemia P:   Replete lytes as needed Check lactic acid  GASTROINTESTINAL A:  Post lap P:   Gastric tube PPI  HEMATOLOGIC A:   No acute issue P:    INFECTIOUS A:   Post lap P:    Abx:  8/25 zoysn>>8/25 8/25  zinacef>>  ENDOCRINE A:   DM P:   SSI  NEUROLOGIC A:   Hx of dementia Currently sedated on diprivan drip P:   RASS goal:0 Sedated as needed   FAMILY  - Updates: Daughters updated at bedside.   - Inter-disciplinary family meet or Palliative Care meeting due by:  day     TODAY'S SUMMARY:   81 yo AAM with history of dementia/falls, Afib but not on anticoagulation due to falls, who presented to [facility] 8/24 with acute abd pain. She was transferred to [facility] and to Care of Vascular surgery for exp lap with superior mesenteric thrombectomy. She returned from OR to SICU and PCCM asked to assist in post op management.  Brett Canales Minor ACNP Adolph Pollack PCCM Pager [phone] till 3 pm If no answer page [phone] 07-02-2015, 8:27 AM

## 2015-06-06 NOTE — Progress Notes (Signed)
Patient ID: Amber Hood, female   DOB: 1928/04/15, 81 y.o.   MRN: [id number] Vascular Surgery Progress Note  Subjective: 5 hours post-exploratory laparotomy with thromboembolectomy of superior mesenteric artery for mesenteric ischemia. Patient remains sedated on ventilator with weaning per CCM. Blood pressure has been in the 100 systolic range and patient is currently on 30 drops of neo-Synephrine. Beginning to wean.  Objective:  Filed Vitals:   05/27/2015 0806  BP: 104/69  Pulse: 114  Temp:   Resp: 18    Sedated on ventilator but arousable Abdomen relatively soft 3+ femoral pulses bilaterally with Doppler flow in both feet. Both feet pink and well perfused.   Labs:  Recent Labs Lab 05/28/2015 0430  CREATININE 0.75    Recent Labs Lab 06/10/2015 0430  NA 138  K 3.3*  CL 109  CO2 19*  BUN 14  CREATININE 0.75  GLUCOSE 242*  CALCIUM 7.7*    Recent Labs Lab 05/19/2015 0430  WBC 19.7*  HGB 11.5*  HCT 34.5*  PLT 211    Recent Labs Lab 06/08/2015 0430  INR 1.37    I/O last 3 completed shifts: In: 3020.5 [I.V.:2970.5; NG/GT:30; IV Piggyback:20] Out: 1260 [Urine:860; Blood:400]  Imaging: Dg Chest Port 1 View  06/11/2015   CLINICAL DATA:  Intubation.  Prior surgery.  EXAM: PORTABLE CHEST - 1 VIEW  COMPARISON:  06/01/2015.  FINDINGS: Endotracheal tube tip 4 cm above the carina. Mediastinum and hilar structures are stable. Heart size stable. Low lung volumes with bilateral subsegmental atelectasis. No pleural effusion or pneumothorax. Degenerative changes thoracic spine. Surgical staples noted over the abdomen. Surgical clips right upper quadrant.  IMPRESSION: 1. Endotracheal tube tip 4 cm above the carina. 2. Low lung volumes with mild bilateral subsegmental atelectasis .   Electronically Signed   By: Maisie Fus  Register   On: 06/08/2015 07:04   Dg Chest Port 1 View  05/22/2015   CLINICAL DATA:  Preop.  Ischemic bowel.  EXAM: PORTABLE CHEST - 1 VIEW  COMPARISON:  None.   FINDINGS: Shallow inspiration. Borderline heart size and pulmonary vascularity, likely normal for technique. No focal consolidation in the lungs. Calcified and tortuous aorta. No blunting of costophrenic angles. No pneumothorax. Degenerative changes in the spine.  IMPRESSION: No active disease.   Electronically Signed   By: Burman Nieves M.D.   On: 05/29/2015 02:14   Dg Abd Portable 1v  05/28/2015   CLINICAL DATA:  Laparoscopic cholecystectomy.  EXAM: PORTABLE ABDOMEN - 1 VIEW  COMPARISON:  CT 06/05/2015.  FINDINGS: NG tube noted with tip projected over the stomach. Surgical clips right upper quadrant. Surgical staples over the mid abdomen. No bowel distention or free air. Degenerative changes scoliosis lumbar spine. Pelvic phleboliths. Degenerative changes both hips.  IMPRESSION: 1.  NG tube noted with tip projected over the stomach.  2. Surgical staples over the abdomen. No bowel distention. No acute abnormality identified.   Electronically Signed   By: Maisie Fus  Register   On: 05/20/2015 07:07    Assessment/Plan:  POD #1   LOS: 0 days  s/p Procedure(s): Exploratory Laparotomy; Thromboembolectomy of Superior Mesenteric Artery  Patient stable post thromboembolectomy of SMA 5 hours ago for bowel ischemia. Bowel appeared viable following thromboembolectomy with no evidence of full-thickness necrosis or gangrene. There was some hemorrhage in the mesentery particularly in the distal small bowel in the ileum.  Patient currently on neo-drip which is being weaned and hopefully patient will be extubated later today Patient in A. fib with rate about 110-120-not  on Cardizem drip at present time Patient was not chronically anticoagulated prior to this event. She has had chronic A. fib for many years according to daughters  We'll get cardiology consult to assist with cardiac arrhythmias and decisions regarding chronic anticoagulation which she should have now since she has had embolic event Patient stable at  present time   Josephina Gip, MD 23-Jun-2015 9:30 AM

## 2015-06-06 NOTE — Progress Notes (Signed)
eLink Physician-Brief Progress Note Patient Name: Amber Hood DOB: 1928-08-23 MRN: [id number]   Date of Service  06/05/2015  HPI/Events of Note  Hypokalemia and hypomag  eICU Interventions  Potassium and mag replaced     Intervention Category Intermediate Interventions: Electrolyte abnormality - evaluation and management  DETERDING,ELIZABETH 05/29/2015, 5:59 AM

## 2015-06-06 NOTE — Anesthesia Postprocedure Evaluation (Signed)
  Anesthesia Post-op Note  Patient: Amber Hood  Procedure(s) Performed: Procedure(s): Exploratory Laparotomy; Thromboembolectomy of Superior Mesenteric Artery (N/A)  Patient Location: SICU  Anesthesia Type:General  Level of Consciousness: Patient remains intubated per anesthesia plan  Airway and Oxygen Therapy: Patient remains intubated per anesthesia plan and Patient placed on Ventilator (see vital sign flow sheet for setting)  Post-op Pain: none  Post-op Assessment: Post-op Vital signs reviewed              Post-op Vital Signs: Reviewed  Last Vitals: There were no vitals filed for this visit.  Complications: No apparent anesthesia complications

## 2015-06-06 NOTE — Progress Notes (Signed)
PT Cancellation Note  Patient Details Name: Amber LovetteMessina [id number] DOB: July 25, 1928   Cancelled Treatment:    Reason Eval/Treat Not Completed: Medical issues which prohibited therapy. Pt tachycardic at rest 120's-140's. Will hold PT eval and check back for appropriateness to participate.   Conni Slipper 05/23/2015, 2:32 PM   Conni Slipper, PT, DPT Acute Rehabilitation Services Pager: [phone]

## 2015-06-07 ENCOUNTER — Inpatient Hospital Stay (HOSPITAL_COMMUNITY): Payer: Medicare (Managed Care)

## 2015-06-07 ENCOUNTER — Encounter (HOSPITAL_COMMUNITY): Payer: Self-pay | Admitting: Vascular Surgery

## 2015-06-07 DIAGNOSIS — R579 Shock, unspecified: Secondary | ICD-10-CM

## 2015-06-07 LAB — POCT I-STAT 7, (LYTES, BLD GAS, ICA,H+H)
Acid-base deficit: 5 mmol/L — ABNORMAL HIGH (ref 0.0–2.0)
BICARBONATE: 19.1 meq/L — AB (ref 20.0–24.0)
Calcium, Ion: 1.1 mmol/L — ABNORMAL LOW (ref 1.13–1.30)
HCT: 30 % — ABNORMAL LOW (ref 36.0–46.0)
Hemoglobin: 10.2 g/dL — ABNORMAL LOW (ref 12.0–15.0)
O2 SAT: 100 %
PH ART: 7.453 — AB (ref 7.350–7.450)
PO2 ART: 343 mmHg — AB (ref 80.0–100.0)
Patient temperature: 33.6
Potassium: 3.5 mmol/L (ref 3.5–5.1)
Sodium: 138 mmol/L (ref 135–145)
TCO2: 20 mmol/L (ref 0–100)
pCO2 arterial: 26.4 mmHg — ABNORMAL LOW (ref 35.0–45.0)

## 2015-06-07 LAB — OCCULT BLOOD X 1 CARD TO LAB, STOOL: FECAL OCCULT BLD: POSITIVE — AB

## 2015-06-07 LAB — TYPE AND SCREEN
ABO/RH(D): O NEG
Antibody Screen: NEGATIVE
UNIT DIVISION: 0
UNIT DIVISION: 0
Unit division: 0
Unit division: 0

## 2015-06-07 LAB — BASIC METABOLIC PANEL
ANION GAP: 4 — AB (ref 5–15)
BUN: 13 mg/dL (ref 6–20)
CALCIUM: 7.6 mg/dL — AB (ref 8.9–10.3)
CHLORIDE: 109 mmol/L (ref 101–111)
CO2: 19 mmol/L — AB (ref 22–32)
Creatinine, Ser: 0.85 mg/dL (ref 0.44–1.00)
GFR calc Af Amer: >60 mL/min (ref >60)
GFR calc non Af Amer: >60 mL/min (ref >60)
GLUCOSE: 136 mg/dL — AB (ref 65–99)
POTASSIUM: 4.5 mmol/L (ref 3.5–5.1)
Sodium: 132 mmol/L — ABNORMAL LOW (ref 135–145)

## 2015-06-07 LAB — GLUCOSE, CAPILLARY
GLUCOSE-CAPILLARY: 145 mg/dL — AB (ref 65–99)
GLUCOSE-CAPILLARY: 105 mg/dL — AB (ref 65–99)
Glucose-Capillary: 136 mg/dL — ABNORMAL HIGH (ref 65–99)
Glucose-Capillary: 108 mg/dL — ABNORMAL HIGH (ref 65–99)
Glucose-Capillary: 100 mg/dL — ABNORMAL HIGH (ref 65–99)

## 2015-06-07 LAB — CBC
HEMATOCRIT: 32.5 % — AB (ref 36.0–46.0)
HEMOGLOBIN: 11 g/dL — AB (ref 12.0–15.0)
MCH: 30.1 pg (ref 26.0–34.0)
MCHC: 33.8 g/dL (ref 30.0–36.0)
MCV: 88.8 fL (ref 78.0–100.0)
Platelets: 226 10*3/uL (ref 150–400)
RBC: 3.66 MIL/uL — ABNORMAL LOW (ref 3.87–5.11)
RDW: 13.3 % (ref 11.5–15.5)
WBC: 19.9 10*3/uL — ABNORMAL HIGH (ref 4.0–10.5)

## 2015-06-07 LAB — TSH: TSH: 0.414 u[IU]/mL (ref 0.350–4.500)

## 2015-06-07 LAB — PHOSPHORUS: Phosphorus: 1.9 mg/dL — ABNORMAL LOW (ref 2.5–4.6)

## 2015-06-07 LAB — HEPARIN LEVEL (UNFRACTIONATED): HEPARIN UNFRACTIONATED: 0.22 [IU]/mL — AB (ref 0.30–0.70)

## 2015-06-07 LAB — MAGNESIUM: Magnesium: 2.2 mg/dL (ref 1.7–2.4)

## 2015-06-07 LAB — LACTIC ACID, PLASMA: LACTIC ACID, VENOUS: 1.8 mmol/L (ref 0.5–2.0)

## 2015-06-07 MED ORDER — DIGOXIN 0.25 MG/ML IJ SOLN
0.2500 mg | Freq: Every day | INTRAMUSCULAR | Status: DC
  Administered 2015-06-08 – 2015-06-12 (×5): 0.25 mg via INTRAVENOUS
  Filled 2015-06-07 (×5): qty 1

## 2015-06-07 MED ORDER — DIGOXIN 0.25 MG/ML IJ SOLN
0.2500 mg | Freq: Four times a day (QID) | INTRAMUSCULAR | Status: AC
  Administered 2015-06-07 (×2): 0.25 mg via INTRAVENOUS
  Filled 2015-06-07 (×2): qty 1

## 2015-06-07 MED ORDER — HEPARIN (PORCINE) IN NACL 100-0.45 UNIT/ML-% IJ SOLN
1050.0000 [IU]/h | INTRAMUSCULAR | Status: DC
  Administered 2015-06-07: 800 [IU]/h via INTRAVENOUS
  Administered 2015-06-09: 900 [IU]/h via INTRAVENOUS
  Administered 2015-06-10 – 2015-06-11 (×2): 1050 [IU]/h via INTRAVENOUS
  Filled 2015-06-07 (×8): qty 250

## 2015-06-07 MED ORDER — SODIUM PHOSPHATE 3 MMOLE/ML IV SOLN
30.0000 mmol | Freq: Once | INTRAVENOUS | Status: AC
  Administered 2015-06-07: 30 mmol via INTRAVENOUS
  Filled 2015-06-07: qty 10

## 2015-06-07 NOTE — Progress Notes (Signed)
eLink Physician-Brief Progress Note Patient Name: Amber Hood DOB: [birth year] MRN: [id number]   Date of Service  06/07/2015  HPI/Events of Note  Hypophosphatemia  eICU Interventions  Phos replaced     Intervention Category Intermediate Interventions: Electrolyte abnormality - evaluation and management  Anyia Gierke 06/07/2015, 5:48 AM

## 2015-06-07 NOTE — Progress Notes (Signed)
Patient ID: Amber Hood, female   DOB: 26-Dec-1927, 81 y.o.   MRN: [id number] Vascular Surgery Progress Note  Subjective: One-day post thromboembolectomy of superior mesenteric artery for mesenteric ischemia. No evidence of infarcted or gangrenous bowel at time of surgery. Patient remains sedated on ventilator. Had a few problems with mild hypotension yesterday probably associated with rapid A. fib with rates in the 1:30 to 150 range. Rate has been better controlled during the night but patient remains sedated. Only pressors currently are Neo-Synephrine at 10 drops.  Objective:  Filed Vitals:   06/07/15 0500  BP: 104/62  Pulse:   Temp:   Resp: 17    Gen. sedated on ventilator Lungs clear no rhonchi or wheezing Abdomen appropriately soft no guarding Extremities 3+ femoral pulse palpable and Doppler flow in both feet-feet are cool   Labs:  Recent Labs Lab 06/02/2015 0430 06/07/15 0508  CREATININE 0.75 0.85    Recent Labs Lab 05/31/2015 0430 06/07/15 0508  NA 138 132*  K 3.3* 4.5  CL 109 109  CO2 19* 19*  BUN 14 13  CREATININE 0.75 0.85  GLUCOSE 242* 136*  CALCIUM 7.7* 7.6*    Recent Labs Lab 05/19/2015 0430 06/05/2015 1410 06/07/15 0508  WBC 19.7* 16.5* 19.9*  HGB 11.5* 11.6* 11.0*  HCT 34.5* 34.4* 32.5*  PLT 211 196 226    Recent Labs Lab 05/29/2015 0430  INR 1.37    I/O last 3 completed shifts: In: 6793.5 [I.V.:6103.5; NG/GT:120; IV Piggyback:570] Out: 2170 [Urine:1520; Emesis/NG output:250; Blood:400]  Imaging: Dg Chest Port 1 View  06/07/2015   CLINICAL DATA:  Postoperative examination, history of new acute mesenteric ischemia, intubated patient.  EXAM: PORTABLE CHEST - 1 VIEW  COMPARISON:  Portable chest x-ray of June 06, 2015  FINDINGS: The lungs are borderline hypoinflated. There is subsegmental atelectasis in the right infrahilar region. The left lower lobe is more dense today and there is new obscuration of the left hemidiaphragm. The cardiac  silhouette is mildly enlarged. The pulmonary vascularity is not engorged. The endotracheal tube tip lies 3.2 cm above the carina. The esophagogastric tube tip and proximal port lie below the GE junction. The right internal jugular Cordis sheath tip projects at the junction of the right and left brachiocephalic veins.  IMPRESSION: Interval development of bibasilar atelectasis. There is no pulmonary edema nor significant pleural effusion. The support tubes are in reasonable position.   Electronically Signed   By: David  [place] M.D.   On: 06/07/2015 07:29   Dg Chest Port 1 View  06/03/2015   CLINICAL DATA:  Intubation.  Prior surgery.  EXAM: PORTABLE CHEST - 1 VIEW  COMPARISON:  06/08/2015.  FINDINGS: Endotracheal tube tip 4 cm above the carina. Mediastinum and hilar structures are stable. Heart size stable. Low lung volumes with bilateral subsegmental atelectasis. No pleural effusion or pneumothorax. Degenerative changes thoracic spine. Surgical staples noted over the abdomen. Surgical clips right upper quadrant.  IMPRESSION: 1. Endotracheal tube tip 4 cm above the carina. 2. Low lung volumes with mild bilateral subsegmental atelectasis .   Electronically Signed   By: Maisie Fus  Register   On: 06/10/2015 07:04   Dg Chest Port 1 View  05/27/2015   CLINICAL DATA:  Preop.  Ischemic bowel.  EXAM: PORTABLE CHEST - 1 VIEW  COMPARISON:  None.  FINDINGS: Shallow inspiration. Borderline heart size and pulmonary vascularity, likely normal for technique. No focal consolidation in the lungs. Calcified and tortuous aorta. No blunting of costophrenic angles. No pneumothorax. Degenerative changes  in the spine.  IMPRESSION: No active disease.   Electronically Signed   By: Burman Nieves M.D.   On: 05/21/2015 02:14   Dg Abd Portable 1v  05/24/2015   CLINICAL DATA:  Laparoscopic cholecystectomy.  EXAM: PORTABLE ABDOMEN - 1 VIEW  COMPARISON:  CT 06/05/2015.  FINDINGS: NG tube noted with tip projected over the stomach. Surgical  clips right upper quadrant. Surgical staples over the mid abdomen. No bowel distention or free air. Degenerative changes scoliosis lumbar spine. Pelvic phleboliths. Degenerative changes both hips.  IMPRESSION: 1.  NG tube noted with tip projected over the stomach.  2. Surgical staples over the abdomen. No bowel distention. No acute abnormality identified.   Electronically Signed   By: Maisie Fus  Register   On: 06/09/2015 07:07    Assessment/Plan:  POD #1  LOS: 1 day  s/p Procedure(s): Exploratory Laparotomy; Thromboembolectomy of Superior Mesenteric Artery  Patient is hemodynamically stable this morning with A. fib better controlled using periodic IV Lopressor Patient is not on Cardizem drip Currently rate is 100-110 with blood pressure 110/70 White blood count stable at 19,000 hematocrit 33% Renal function is stable with urine output yesterday 600 cc Nasogastric output is 250 cc for 24 hours  Hopefully patient can be extubated today after discontinuing sedation by CCM Cardiology consult pending today regarding A. fib Will leave nasogastric tube in place today and if patient gets extubated can probably DC this in a.m. Will begin heparin at 800 units per hour today and begin regulation per pharmacy on Saturday  Generally doing well   Josephina Gip, MD 06/07/2015 7:42 AM

## 2015-06-07 NOTE — Care Management Important Message (Signed)
Important Message  Patient Details  Name: Amber Hood MRN: [id number] Date of Birth: [birth year]   Medicare Important Message Given:       Epifanio Lesches, RN 06/07/2015, 1:50 PM

## 2015-06-07 NOTE — Progress Notes (Signed)
   06/07/15 0800  Clinical Encounter Type  Visited With Health care provider;Patient not available  Visit Type Initial  Referral From Nurse;Physician  Consult/Referral To Chaplain  [facility] responding to consult; pt unavailable (sedated on ventilator); discussed with RN; consult closed at this time.

## 2015-06-07 NOTE — Progress Notes (Signed)
ANTICOAGULATION CONSULT NOTE - Initial Consult  Pharmacy Consult for heparin Indication: atrial fibrillation  No Known Allergies  Patient Measurements: Height: 5' 1.42" (156 cm) Weight: 147 lb 11.3 oz (67 kg) IBW/kg (Calculated) : 48.76 Heparin Dosing Weight: 63kg  Vital Signs: Temp: 98.9 F (37.2 C) (08/26 0351) Temp Source: Oral (08/26 0351) BP: 104/62 mmHg (08/26 0500) Pulse Rate: 102 (08/26 0330)  Labs:  Recent Labs  05/24/2015 0430 05/20/2015 1100 06/11/2015 1410 06/07/15 0508  HGB 11.5*  --  11.6* 11.0*  HCT 34.5*  --  34.4* 32.5*  PLT 211  --  196 226  APTT 29  --   --   --   LABPROT 17.0*  --   --   --   INR 1.37  --   --   --   CREATININE 0.75  --   --  0.85  TROPONINI  --  0.07*  --   --     Estimated Creatinine Clearance: 42.9 mL/min (by C-G formula based on Cr of 0.85).   Medical History: Past Medical History  Diagnosis Date  . Diabetes mellitus   . Hypertension   . Hyperlipidemia   . Thyroid disease   . Dementia     Medications:  Prescriptions prior to admission  Medication Sig Dispense Refill Last Dose  . amLODipine (NORVASC) 5 MG tablet Take 5 mg by mouth daily.     Marland Kitchen aspirin 81 MG chewable tablet Chew 81 mg by mouth daily.   06/04/2015 at Unknown time  . atorvastatin (LIPITOR) 10 MG tablet Take 10 mg by mouth daily at 6 PM.      . cetirizine (ZYRTEC) 10 MG tablet Take 1 tablet (10 mg total) by mouth daily as needed. (Patient taking differently: Take 10 mg by mouth daily. ) 30 tablet 6   . Cholecalciferol (VITAMIN D3) 2000 UNITS capsule Take 1,000 Units by mouth daily.      Marland Kitchen glimepiride (AMARYL) 1 MG tablet Take 1 mg by mouth every morning.     . hydrochlorothiazide (HYDRODIURIL) 25 MG tablet Take 1 tablet (25 mg total) by mouth daily. 30 tablet 6   . lamoTRIgine (LAMICTAL) 100 MG tablet Take 100 mg by mouth daily.   06/04/2015  . lisinopril (PRINIVIL,ZESTRIL) 40 MG tablet Take 1 tablet (40 mg total) by mouth daily. 30 tablet 6   . memantine  (NAMENDA) 10 MG tablet Take 10 mg by mouth 2 (two) times daily.     . metFORMIN (GLUCOPHAGE) 500 MG tablet Take 500 mg by mouth daily.    at Unknown time  . metoprolol succinate (TOPROL XL) 25 MG 24 hr tablet Take 25 mg by mouth daily.     . mirtazapine (REMERON) 15 MG tablet Take 15 mg by mouth at bedtime.     . polyethylene glycol (MIRALAX / GLYCOLAX) packet Take 17 g by mouth daily as needed. (Patient taking differently: Take 17 g by mouth daily as needed for mild constipation. ) 30 each 6   . amitriptyline (ELAVIL) 50 MG tablet Take 1 tablet (50 mg total) by mouth at bedtime. 30 tablet 3   . atorvastatin (LIPITOR) 40 MG tablet Take 1 tablet (40 mg total) by mouth daily. (Patient not taking: Reported on 05/24/2015) 30 tablet 6 Not Taking at Unknown time  . metFORMIN (GLUCOPHAGE-XR) 500 MG 24 hr tablet Take 1 tablet (500 mg total) by mouth 2 (two) times daily. (Patient not taking: Reported on 05/29/2015) 60 tablet 6 Not Taking at Unknown  time  . traMADol (ULTRAM) 50 MG tablet Take 1 tablet (50 mg total) by mouth every 6 (six) hours as needed. (Patient taking differently: Take 50 mg by mouth every 6 (six) hours as needed for moderate pain. ) 30 tablet 3   . zolpidem (AMBIEN CR) 6.25 MG CR tablet Take 1 tablet (6.25 mg total) by mouth at bedtime as needed. (Patient taking differently: Take 6.25 mg by mouth at bedtime as needed for sleep. ) 30 tablet 3    Scheduled:  . antiseptic oral rinse  7 mL Mouth Rinse QID  . chlorhexidine gluconate  15 mL Mouth Rinse BID  . docusate sodium  100 mg Oral Daily  . Influenza vac split quadrivalent PF  0.5 mL Intramuscular Tomorrow-1000  . insulin aspart  0-15 Units Subcutaneous 6 times per day  . pantoprazole (PROTONIX) IV  40 mg Intravenous Daily  . sodium phosphate  Dextrose 5% IVPB  30 mmol Intravenous Once    Assessment: 81 yo female with afib (CHADSVASC= 6; age, gender, HTN, DM, vascular disease) to begin heparin at 800 units/hr per MD and pharmacy to dose  beginning 06/08/15. She is noted with mesenteric ischemia due to embolus and thrombus and SMA now s/p exploratory laparotomy; thromboembolectomy of superior mesenteric artery on 8/26.   Goal of Therapy:  Heparin level 0.3-0.7 units/ml Monitor platelets by anticoagulation protocol: Yes   Plan:  -Heparin at 800 units/hr per MD -Heparin level in 8 hrs -Daily heparin level and CBC -Pharmacy to dose heparin in am  Harland German, Pharm D 06/07/2015 7:59 AM

## 2015-06-07 NOTE — Progress Notes (Signed)
Found pt on cpap/ps.  Per RN, MD changed pt to wean while he was rounding.  Pt appears to be tol well so far.  VSS.

## 2015-06-07 NOTE — Progress Notes (Signed)
PULMONARY / CRITICAL CARE MEDICINE   Name: Amber Hood MRN: [id number] DOB: 1928/09/13    ADMISSION DATE:  05/16/2015 CONSULTATION DATE: 8/25  REFERRING MD :  Hart Rochester  CHIEF COMPLAINT:  Abd pain  INITIAL PRESENTATION: To [facility] with abd pain  HISTORY OF PRESENT ILLNESS:   81 yo AAM with history of dementia/falls, Afib but not on anticoagulation due to falls, who presented to [facility] 8/24 with acute abd pain. She was transferred to [facility] and to Care of Vascular surgery for exp lap with superior mesenteric thrombectomy. She returned from OR to SICU and PCCM asked to assist in post op management.  STUDIES:    SIGNIFICANT EVENTS: 8/25 p lap mesenteric thrombus  SUBJECTIVE: remains critically ill sedated on low dose propofol gtt On low dose neo gtt UO picking up  VITAL SIGNS: Temp:  [97.9 F (36.6 C)-99 F (37.2 C)] 97.9 F (36.6 C) (08/26 0700) Pulse Rate:  [36-139] 108 (08/26 0845) Resp:  [0-26] 17 (08/26 0845) BP: (80-131)/(47-79) 108/67 mmHg (08/26 0845) SpO2:  [83 %-100 %] 100 % (08/26 0330) Arterial Line BP: (61-118)/(49-76) 109/67 mmHg (08/26 0500) FiO2 (%):  [40 %-50 %] 40 % (08/26 0845) HEMODYNAMICS: CVP:  [0 mmHg-10 mmHg] 10 mmHg VENTILATOR SETTINGS: Vent Mode:  [-] PRVC FiO2 (%):  [40 %-50 %] 40 % Set Rate:  [12 bmp-14 bmp] 12 bmp Vt Set:  [400 mL-500 mL] 400 mL PEEP:  [5 cmH20] 5 cmH20 Pressure Support:  [10 cmH20] 10 cmH20 Plateau Pressure:  [21 cmH20-22 cmH20] 21 cmH20 INTAKE / OUTPUT:  Intake/Output Summary (Last 24 hours) at 06/07/15 0853 Last data filed at 06/07/15 0600  Gross per 24 hour  Intake 3607.98 ml  Output    910 ml  Net 2697.98 ml    PHYSICAL EXAMINATION: General:  Frail, elderly AAF sedated on vent Neuro: Sedated on vent, opens eyes to command HEENT:  No jvd/lan Cardiovascular:  HSIR Afib Lungs: Decreased bs bases Abdomen: Dressing CDI Musculoskeletal: Intact Skin: cool and dry  LABS:  CBC  Recent Labs Lab 05/22/2015 0430  06/05/2015 1410 06/07/15 0508  WBC 19.7* 16.5* 19.9*  HGB 11.5* 11.6* 11.0*  HCT 34.5* 34.4* 32.5*  PLT 211 196 226   Coag's  Recent Labs Lab 06/12/2015 0430  APTT 29  INR 1.37   BMET  Recent Labs Lab 06/05/2015 0430 06/07/15 0508  NA 138 132*  K 3.3* 4.5  CL 109 109  CO2 19* 19*  BUN 14 13  CREATININE 0.75 0.85  GLUCOSE 242* 136*   Electrolytes  Recent Labs Lab 06/01/2015 0430 06/07/15 0508  CALCIUM 7.7* 7.6*  MG 1.1* 2.2  PHOS  --  1.9*   Sepsis Markers  Recent Labs Lab 06/12/2015 1100  LATICACIDVEN 3.0*   ABG  Recent Labs Lab 05/26/2015 0437 06/03/2015 0950 05/19/2015 1359  PHART 7.369 7.334* 7.336*  PCO2ART 32.1* 33.3* 34.2*  PO2ART 75.0* 181.0* 118.0*   Liver Enzymes  Recent Labs Lab 05/17/2015 0430  AST 40  ALT 12*  ALKPHOS 46  BILITOT 0.9  ALBUMIN 2.3*   Cardiac Enzymes  Recent Labs Lab 06/12/2015 1100  TROPONINI 0.07*   Glucose  Recent Labs Lab 05/21/2015 1203 06/05/2015 1619 05/17/2015 1909 05/19/2015 2345 06/07/15 0332 06/07/15 0802  GLUCAP 258* 200* 176* 165* 136* 145*    Imaging Dg Chest Port 1 View  06/07/2015   CLINICAL DATA:  Postoperative examination, history of new acute mesenteric ischemia, intubated patient.  EXAM: PORTABLE CHEST - 1 VIEW  COMPARISON:  Portable  chest x-ray of 06/25/2015  FINDINGS: The lungs are borderline hypoinflated. There is subsegmental atelectasis in the right infrahilar region. The left lower lobe is more dense today and there is new obscuration of the left hemidiaphragm. The cardiac silhouette is mildly enlarged. The pulmonary vascularity is not engorged. The endotracheal tube tip lies 3.2 cm above the carina. The esophagogastric tube tip and proximal port lie below the GE junction. The right internal jugular Cordis sheath tip projects at the junction of the right and left brachiocephalic veins.  IMPRESSION: Interval development of bibasilar atelectasis. There is no pulmonary edema nor significant pleural  effusion. The support tubes are in reasonable position.   Electronically Signed   By: David  Swaziland M.D.   On: 06/07/2015 07:29     ASSESSMENT / PLAN:  PULMONARY OETT 8/25>> A: VDRF post lap P:   Vent bundle SBTs with goal extubation  CARDIOVASCULAR CVL 8/25 rt i j >> A:  Post op hypotension Hx of HTN AF -RVR - deemed not a candidate for anticoagulation due to falls  P:  Neo gtt CVP goal 10 & above Short term heparin ok - not sure can use this long term  RENAL A:  Hypokalemia Hypophos P:   Replete lytes as needed   GASTROINTESTINAL A:  Post lap Concern for ischemic bowel P:   PPI Npo -start when ok with vascular  HEMATOLOGIC A:   No acute issue P:    INFECTIOUS A:   Post lap P:    Abx:  8/25 zoysn>>8/25 8/25 zinacef>>  ENDOCRINE A:   DM P:   SSI  NEUROLOGIC A:   Hx of dementia  P:   RASS goal:0 Fent prn Try to avoid propofol   FAMILY  - Updates: Daughters updated at bedside.   - Inter-disciplinary family meet or Palliative Care meeting due by:  day     TODAY'S SUMMARY:   81 yo AAM with history of dementia/falls, Afib but not on anticoagulation due to falls, who presented to [facility] 8/24 with acute abd pain. She  underwentexp lap with superior mesenteric thrombectomy. Extubation delayed due to tachy & hypotension with concern for ischemic bowel in the background  The patient is critically ill with multiple organ systems failure and requires high complexity decision making for assessment and support, frequent evaluation and titration of therapies, application of advanced monitoring technologies and extensive interpretation of multiple databases. Critical Care Time devoted to patient care services described in this note independent of APP time is 35 minutes.   Cyril Mourning MD. Tonny Bollman. [place] Pager [phone] If no response call [phone]     06/07/2015, 8:53 AM

## 2015-06-07 NOTE — Consult Note (Addendum)
Admit date: 06/08/2015 Referring Physician  Dr. Hart Rochester Primary Cardiologist  None Reason for Consultation  afib  HPI: Amber Hood is a 81 y.o. female who initially presented for evaluation of abdominal pain. Patient was evaluated at [facility] for acute onset of abdominal pain. 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 Patient has a long history of intermittent atrial fibrillation. She is not on chronic anticoagulation presumably due to dementia and falls. She was transferred here for attempted thrombectomy of superior mesenteric artery. She has had no previous emboli. She has had previous abdominal procedures.  She underwent exploratory lap with thromboembolectomy of the SMA for mesenteric ischemia on 8/25.  On admission, patient was in chronic atrial fibrillation which apparently she has been in for years.  Post op she has been having elevated heart rates in the 120-140's.during attempts at vent wean.  She has been on low dose Neo for pressor support and was started on low dose lopressor for rate control.  She remains intubated on low dose propofol.  HR is 110's on low dose lopressor.  Cardiology is now asked to consult for treatment of afib with RVR and questions on anticoagulation.     PMH:   Past Medical History  Diagnosis Date  . Diabetes mellitus   . Hypertension   . Hyperlipidemia   . Thyroid disease   . Dementia      PSH:   Past Surgical History  Procedure Laterality Date  . Embolectomy N/A 05/19/2015    Procedure: Exploratory Laparotomy; Thromboembolectomy of Superior Mesenteric Artery;  Surgeon: Pryor Ochoa, MD;  Location: [facility] OR;  Service: Vascular;  Laterality: N/A;    Allergies:  Review of patient's allergies indicates no known allergies. Prior to Admit Meds:   Prescriptions prior to admission  Medication Sig Dispense Refill Last Dose  . amLODipine (NORVASC) 5 MG tablet Take 5 mg by mouth daily.   06/04/2015 at Unknown time    . aspirin 81 MG chewable tablet Chew 81 mg by mouth daily.   06/04/2015 at Unknown time  . atorvastatin (LIPITOR) 10 MG tablet Take 10 mg by mouth daily at 6 PM.      . atorvastatin (LIPITOR) 10 MG tablet Take 10 mg by mouth daily.   06/04/2015 at Unknown time  . cetirizine (ZYRTEC) 10 MG tablet Take 1 tablet (10 mg total) by mouth daily as needed. (Patient taking differently: Take 10 mg by mouth daily. ) 30 tablet 6 06/04/2015 at Unknown time  . cholecalciferol (VITAMIN D) 1000 UNITS tablet Take 1,000 Units by mouth daily.   06/04/2015 at Unknown time  . glimepiride (AMARYL) 1 MG tablet Take 1 mg by mouth every morning.   06/03/2015 at Unknown time  . hydrochlorothiazide (HYDRODIURIL) 25 MG tablet Take 1 tablet (25 mg total) by mouth daily. 30 tablet 6 06/04/2015 at Unknown time  . lamoTRIgine (LAMICTAL) 100 MG tablet Take 100 mg by mouth daily.   06/03/2015  . lisinopril (PRINIVIL,ZESTRIL) 40 MG tablet Take 1 tablet (40 mg total) by mouth daily. 30 tablet 6 06/04/2015 at Unknown time  . memantine (NAMENDA) 10 MG tablet Take 10 mg by mouth 2 (two) times daily.   06/04/2015 at Unknown time  . metFORMIN (GLUCOPHAGE) 500 MG tablet Take 500 mg by mouth daily.   06/07/2015 at Unknown time  . metoprolol succinate (TOPROL XL) 25 MG 24 hr tablet Take 25 mg by mouth daily.     . mirtazapine (REMERON) 15 MG  tablet Take 15 mg by mouth at bedtime.   06/04/2015 at Unknown time  . polyethylene glycol (MIRALAX / GLYCOLAX) packet Take 17 g by mouth daily as needed. (Patient taking differently: Take 17 g by mouth daily as needed for mild constipation. ) 30 each 6 Past Week at Unknown time   Fam HX:   History reviewed. No pertinent family history. Social HX:    Social History   Social History  . Marital Status: Widowed    Spouse Name: N/A  . Number of Children: N/A  . Years of Education: N/A   Occupational History  . Not on file.   Social History Main Topics  . Smoking status: Never Smoker   . Smokeless tobacco:  Not on file  . Alcohol Use: No  . Drug Use: No  . Sexual Activity: Not on file   Other Topics Concern  . Not on file   Social History Narrative     ROS:  All 11 ROS were addressed and are negative except what is stated in the HPI  Physical Exam: Blood pressure 108/67, pulse 108, temperature 97.9 F (36.6 C), temperature source Oral, resp. rate 17, height 5' 1.42" (1.56 m), weight 147 lb 11.3 oz (67 kg), SpO2 100 %.    General: Intubated and sedated Head: Eyes PERRLA, No xanthomas.   Normal cephalic and atramatic  Lungs:   Clear bilaterally to auscultation and percussion. Heart:   Irregularly irregular S1 S2 Pulses are 2+ & equal.            No carotid bruit. No JVD.  No abdominal bruits. No femoral bruits. Abdomen: Bowel sounds are positive, abdomen soft and non-tender without masses Extremities:   No clubbing, cyanosis or edema.  DP +1   Labs:   Lab Results  Component Value Date   WBC 19.9* 06/07/2015   HGB 11.0* 06/07/2015   HCT 32.5* 06/07/2015   MCV 88.8 06/07/2015   PLT 226 06/07/2015    Recent Labs Lab 06-26-2015 0430 06/07/15 0508  NA 138 132*  K 3.3* 4.5  CL 109 109  CO2 19* 19*  BUN 14 13  CREATININE 0.75 0.85  CALCIUM 7.7* 7.6*  PROT 4.8*  --   BILITOT 0.9  --   ALKPHOS 46  --   ALT 12*  --   AST 40  --   GLUCOSE 242* 136*   No results found for: PTT Lab Results  Component Value Date   INR 1.37 June 26, 2015   Lab Results  Component Value Date   TROPONINI 0.07* 06-26-15     Lab Results  Component Value Date   CHOL 146 05/23/2012   Lab Results  Component Value Date   HDL 54.90 05/23/2012   Lab Results  Component Value Date   LDLCALC 75 05/23/2012   Lab Results  Component Value Date   TRIG 46 26-Jun-2015   TRIG 79.0 05/23/2012   Lab Results  Component Value Date   CHOLHDL 3 05/23/2012   No results found for: LDLDIRECT    Radiology:  Dg Chest Port 1 View  06/07/2015   CLINICAL DATA:  Postoperative examination, history of new  acute mesenteric ischemia, intubated patient.  EXAM: PORTABLE CHEST - 1 VIEW  COMPARISON:  Portable chest x-ray of 06/26/2015  FINDINGS: The lungs are borderline hypoinflated. There is subsegmental atelectasis in the right infrahilar region. The left lower lobe is more dense today and there is new obscuration of the left hemidiaphragm. The cardiac silhouette  is mildly enlarged. The pulmonary vascularity is not engorged. The endotracheal tube tip lies 3.2 cm above the carina. The esophagogastric tube tip and proximal port lie below the GE junction. The right internal jugular Cordis sheath tip projects at the junction of the right and left brachiocephalic veins.  IMPRESSION: Interval development of bibasilar atelectasis. There is no pulmonary edema nor significant pleural effusion. The support tubes are in reasonable position.   Electronically Signed   By: David  Swaziland M.D.   On: 06/07/2015 07:29   Dg Chest Port 1 View  06/05/2015   CLINICAL DATA:  Intubation.  Prior surgery.  EXAM: PORTABLE CHEST - 1 VIEW  COMPARISON:  05/16/2015.  FINDINGS: Endotracheal tube tip 4 cm above the carina. Mediastinum and hilar structures are stable. Heart size stable. Low lung volumes with bilateral subsegmental atelectasis. No pleural effusion or pneumothorax. Degenerative changes thoracic spine. Surgical staples noted over the abdomen. Surgical clips right upper quadrant.  IMPRESSION: 1. Endotracheal tube tip 4 cm above the carina. 2. Low lung volumes with mild bilateral subsegmental atelectasis .   Electronically Signed   By: Maisie Fus  Register   On: 05/25/2015 07:04   Dg Chest Port 1 View  05/13/2015   CLINICAL DATA:  Preop.  Ischemic bowel.  EXAM: PORTABLE CHEST - 1 VIEW  COMPARISON:  None.  FINDINGS: Shallow inspiration. Borderline heart size and pulmonary vascularity, likely normal for technique. No focal consolidation in the lungs. Calcified and tortuous aorta. No blunting of costophrenic angles. No pneumothorax.  Degenerative changes in the spine.  IMPRESSION: No active disease.   Electronically Signed   By: Burman Nieves M.D.   On: 05/24/2015 02:14   Dg Abd Portable 1v  05/16/2015   CLINICAL DATA:  Laparoscopic cholecystectomy.  EXAM: PORTABLE ABDOMEN - 1 VIEW  COMPARISON:  CT 06/05/2015.  FINDINGS: NG tube noted with tip projected over the stomach. Surgical clips right upper quadrant. Surgical staples over the mid abdomen. No bowel distention or free air. Degenerative changes scoliosis lumbar spine. Pelvic phleboliths. Degenerative changes both hips.  IMPRESSION: 1.  NG tube noted with tip projected over the stomach.  2. Surgical staples over the abdomen. No bowel distention. No acute abnormality identified.   Electronically Signed   By: Maisie Fus  Register   On: 06/07/2015 07:07    EKG:  Atrial fibrillation with RVR at 108bpm with anterolateral ST abnormality  ASSESSMENT/PLAN: 1.  Atrial fibrillation with RVR - rate improved on low dose BB.  HR currently in the 120's while trying to wake her up.  Renal function stable so recommend adding digoxin for better rate control. Will start with Digoxin 0.25mg  IV q 6 hours x 2 doses then 0.25mg  IV daily and change to PO when taking PO meds.  Would avoid IV amio with possibility of converting to NSR.  In regards to long term chronic anticoagulation, she has not been on anticoagulation in the past for her chronic afib due to dementia and frequent falls.  The patient is intubated and family is not present so I cannot get a feel for how unstable she is on her feet to assess her fall risk.  Clearly she is at increased risk of further cardioembolic events.  This patients CHA2DS2-VASc Score and unadjusted Ischemic Stroke Rate (% per year) is equal to 9.6 % stroke rate/year from a score of 7. Above score calculated as 1 point each if present [CHF, HTN, DM, Vascular=MI/PAD/Aortic Plaque, Age if 65-74, or Female] Above score calculated as 2 points  each if present [Age > 75, or  Stroke/TIA/TE].  Would continue IV Heparin gtt for now.  Once patient is extubated and family available, will need to discuss risks going in setting of dementia and fall risk.  Hopefully we can gain more info from patient on how unsteady she is on her feet and fall risk.  2.  Acute mesenteric ischemia s/p expl lab with suprerior mesenteric artery thromboembolectomy. 3.  HTN 4.  DM 5.  Abnormal EKG with anterolateral ST abnormality - check 2D echo    Quintella Reichert, MD  06/07/2015  9:11 AM

## 2015-06-07 NOTE — Progress Notes (Signed)
OT Cancellation Note  Patient Details Name: Amber Hood MRN: [id number] DOB: June 19, 1928   Cancelled Treatment:    Reason Eval/Treat Not Completed: Patient not medically ready - Pt remains on  Vent.   Angelene Giovanni Cylinder, OTR/L [phone]  06/07/2015, 10:01 AM

## 2015-06-07 NOTE — Progress Notes (Signed)
[facility]  Subjective  - Loose bloody stool this am. Still intubated and sedated.   Objective 104/62 102 98.9 F (37.2 C) (Oral) 17 100%  Intake/Output Summary (Last 24 hours) at 06/07/15 0750 Last data filed at 06/07/15 0600  Gross per 24 hour  Intake 3772.98 ml  Output    910 ml  Net 2862.98 ml    Doppler PT bilaterally  Abdomin soft Incision dressing clean and dry Heart Irregularly irregular rate 120's Lungs intubated sedated  Assessment/Planning: POD # 2   A fib tachycardia rate 120's hypo tensive on neo 10 mcg Heparin started today at 800 per hr  Possible extubation today Bloody stool will send hemoccult per routine IV D5 - with KCL 100 ml/hr WBC elevated 19.9 afebrile  OU 25 -100 cc/hr Cardiology to see today to assist with cardiac arrhythmias and decisions regarding chronic anticoagulation which she should have now since she has had embolic event.    Clinton Gallant [facility] 06/07/2015 7:50 AM --  Laboratory Lab Results:  Recent Labs  06/24/15 1410 06/07/15 0508  WBC 16.5* 19.9*  HGB 11.6* 11.0*  HCT 34.4* 32.5*  PLT 196 226   BMET  Recent Labs  Jun 24, 2015 0430 06/07/15 0508  NA 138 132*  K 3.3* 4.5  CL 109 109  CO2 19* 19*  GLUCOSE 242* 136*  BUN 14 13  CREATININE 0.75 0.85  CALCIUM 7.7* 7.6*    COAG Lab Results  Component Value Date   INR 1.37 Jun 24, 2015   No results found for: PTT

## 2015-06-07 NOTE — Progress Notes (Signed)
PT Cancellation Note  Patient Details Name: Kyomi Hector MRN: [id number] DOB: 1928-10-10   Cancelled Treatment:    Reason Eval/Treat Not Completed: Medical issues which prohibited therapy. Pt remains intubated. Will check back tomorrow for medical readiness to participate.   Conni Slipper 06/07/2015, 11:09 AM  Conni Slipper, PT, DPT Acute Rehabilitation Services Pager: [phone]

## 2015-06-07 NOTE — Progress Notes (Signed)
Initial Nutrition Assessment  DOCUMENTATION CODES:   Not applicable  INTERVENTION:    If TF started, recommend Vital High Protein formula -- initiate at 25 ml/hr and increase by 10 ml every 4 hours to goal rate of 55 ml/hr  TF regimen to provide 1320 kcals, 115 gm protein, 1103 ml of free water  NUTRITION DIAGNOSIS:   Inadequate oral intake related to inability to eat as evidenced by NPO status  GOAL:   Patient will meet greater than or equal to 90% of their needs  MONITOR:   Vent status, Labs, Weight trends, I & O's  REASON FOR ASSESSMENT:   Ventilator  ASSESSMENT:   81 y.o. Female who presents for evaluation of abdominal pain. Patient was evaluated at [facility] for acute onset of abdominal pain at approximately 4 PM. 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 Patient has a long history of intermittent atrial fibrillation. She is not on chronic anticoagulation. She was transferred here for attempted thrombectomy of superior mesenteric artery. She has had no previous emboli. She has had previous abdominal procedures.   Patient s/p procedures 8/25: EXPLORATORY LAPAROTOMY THROMBOEMBOLECTOMY OF SUPERIOR MESENTERIC ARTERY  Patient is currently intubated on ventilator support -- NGT in place MV: 8.5 L/min Temp (24hrs), Avg:98.5 F (36.9 C), Min:97.9 F (36.6 C), Max:99 F (37.2 C)   Diet Order:  Diet NPO time specified  Skin:  Reviewed, no issues  Last BM:  N/A  Height:   Ht Readings from Last 1 Encounters:  03-Jul-2015 5' 1.42" (1.56 m)    Weight:   Wt Readings from Last 1 Encounters:  Jul 03, 2015 147 lb 11.3 oz (67 kg)    Ideal Body Weight:  47.7 kg  BMI:  Body mass index is 27.53 kg/(m^2).  Estimated Nutritional Needs:   Kcal:  1286  Protein:  110-120 gm  Fluid:  per MD  EDUCATION NEEDS:   No education needs identified at this time  Maureen Chatters, RD, LDN Pager #: [phone] After-Hours Pager #:  [phone]

## 2015-06-08 ENCOUNTER — Other Ambulatory Visit (HOSPITAL_COMMUNITY): Payer: Medicare (Managed Care)

## 2015-06-08 ENCOUNTER — Inpatient Hospital Stay (HOSPITAL_COMMUNITY): Payer: Medicare (Managed Care)

## 2015-06-08 DIAGNOSIS — J9600 Acute respiratory failure, unspecified whether with hypoxia or hypercapnia: Secondary | ICD-10-CM

## 2015-06-08 DIAGNOSIS — J9690 Respiratory failure, unspecified, unspecified whether with hypoxia or hypercapnia: Secondary | ICD-10-CM | POA: Insufficient documentation

## 2015-06-08 LAB — BASIC METABOLIC PANEL
Anion gap: 4 — ABNORMAL LOW (ref 5–15)
BUN: 11 mg/dL (ref 6–20)
CALCIUM: 7.9 mg/dL — AB (ref 8.9–10.3)
CO2: 19 mmol/L — ABNORMAL LOW (ref 22–32)
CREATININE: 0.86 mg/dL (ref 0.44–1.00)
Chloride: 110 mmol/L (ref 101–111)
GFR calc Af Amer: >60 mL/min (ref >60)
GFR, EST NON AFRICAN AMERICAN: 60 mL/min — AB (ref >60)
GLUCOSE: 133 mg/dL — AB (ref 65–99)
POTASSIUM: 4.7 mmol/L (ref 3.5–5.1)
SODIUM: 133 mmol/L — AB (ref 135–145)

## 2015-06-08 LAB — BLOOD GAS, ARTERIAL
Acid-base deficit: 6 mmol/L — ABNORMAL HIGH (ref 0.0–2.0)
BICARBONATE: 17.7 meq/L — AB (ref 20.0–24.0)
Drawn by: 10006
FIO2: 0.4
LHR: 12 {breaths}/min
O2 Saturation: 98.3 %
PEEP: 5 cmH2O
Patient temperature: 98.6
TCO2: 18.6 mmol/L (ref 0–100)
VT: 400 mL
pCO2 arterial: 28.5 mmHg — ABNORMAL LOW (ref 35.0–45.0)
pH, Arterial: 7.411 (ref 7.350–7.450)
pO2, Arterial: 114 mmHg — ABNORMAL HIGH (ref 80.0–100.0)

## 2015-06-08 LAB — GLUCOSE, CAPILLARY
GLUCOSE-CAPILLARY: 115 mg/dL — AB (ref 65–99)
GLUCOSE-CAPILLARY: 121 mg/dL — AB (ref 65–99)
GLUCOSE-CAPILLARY: 127 mg/dL — AB (ref 65–99)
Glucose-Capillary: 107 mg/dL — ABNORMAL HIGH (ref 65–99)
Glucose-Capillary: 112 mg/dL — ABNORMAL HIGH (ref 65–99)
Glucose-Capillary: 97 mg/dL (ref 65–99)

## 2015-06-08 LAB — CBC
HCT: 28.8 % — ABNORMAL LOW (ref 36.0–46.0)
Hemoglobin: 9.7 g/dL — ABNORMAL LOW (ref 12.0–15.0)
MCH: 30 pg (ref 26.0–34.0)
MCHC: 33.7 g/dL (ref 30.0–36.0)
MCV: 89.2 fL (ref 78.0–100.0)
PLATELETS: 184 10*3/uL (ref 150–400)
RBC: 3.23 MIL/uL — AB (ref 3.87–5.11)
RDW: 13.3 % (ref 11.5–15.5)
WBC: 17.7 10*3/uL — ABNORMAL HIGH (ref 4.0–10.5)

## 2015-06-08 LAB — HEPARIN LEVEL (UNFRACTIONATED)
Heparin Unfractionated: 0.54 IU/mL (ref 0.30–0.70)
Heparin Unfractionated: 0.42 IU/mL (ref 0.30–0.70)

## 2015-06-08 LAB — MAGNESIUM: MAGNESIUM: 2 mg/dL (ref 1.7–2.4)

## 2015-06-08 LAB — PHOSPHORUS: Phosphorus: 2.3 mg/dL — ABNORMAL LOW (ref 2.5–4.6)

## 2015-06-08 MED ORDER — METOPROLOL TARTRATE 1 MG/ML IV SOLN
5.0000 mg | Freq: Four times a day (QID) | INTRAVENOUS | Status: DC
  Administered 2015-06-08 – 2015-06-09 (×4): 5 mg via INTRAVENOUS
  Filled 2015-06-08 (×7): qty 5

## 2015-06-08 MED ORDER — DEXTROSE 5 % IV SOLN
10.0000 mmol | Freq: Once | INTRAVENOUS | Status: AC
  Administered 2015-06-08: 10 mmol via INTRAVENOUS
  Filled 2015-06-08: qty 3.33

## 2015-06-08 MED ORDER — FENTANYL CITRATE (PF) 100 MCG/2ML IJ SOLN
50.0000 ug | INTRAMUSCULAR | Status: DC | PRN
  Administered 2015-06-08 (×3): 50 ug via INTRAVENOUS
  Administered 2015-06-08: 100 ug via INTRAVENOUS
  Administered 2015-06-08: 50 ug via INTRAVENOUS
  Administered 2015-06-09 (×3): 100 ug via INTRAVENOUS
  Administered 2015-06-09 (×2): 50 ug via INTRAVENOUS
  Administered 2015-06-09: 25 ug via INTRAVENOUS
  Administered 2015-06-09 – 2015-06-10 (×6): 100 ug via INTRAVENOUS
  Administered 2015-06-10: 50 ug via INTRAVENOUS
  Administered 2015-06-10: 100 ug via INTRAVENOUS
  Administered 2015-06-11 (×2): 50 ug via INTRAVENOUS
  Filled 2015-06-08 (×17): qty 2

## 2015-06-08 NOTE — Progress Notes (Signed)
Subjective: Interval History: none.. Comfortable overall. Still on vent. Arouses.   Objective: Vital signs in last 24 hours: Temp:  [98.2 F (36.8 C)-99.7 F (37.6 C)] 99.4 F (37.4 C) (08/27 0801) Pulse Rate:  [26-131] 122 (08/27 0700) Resp:  [13-35] 22 (08/27 0700) BP: (85-143)/(42-92) 132/71 mmHg (08/27 0700) SpO2:  [85 %-100 %] 99 % (08/27 0700) Arterial Line BP: (87-165)/(50-88) 121/68 mmHg (08/27 0700) FiO2 (%):  [40 %] 40 % (08/27 0329) Weight:  [150 lb 12.7 oz (68.4 kg)] 150 lb 12.7 oz (68.4 kg) (08/27 0700)  Intake/Output from previous day: 08/26 0701 - 08/27 0700 In: 2988.5 [I.V.:2928.5; NG/GT:60] Out: 810 [Urine:810] Intake/Output this shift:    Abdomen soft. Moderate tenderness. Feet warm.  Lab Results:  Recent Labs  06/07/15 0508 06/08/15 0519  WBC 19.9* 17.7*  HGB 11.0* 9.7*  HCT 32.5* 28.8*  PLT 226 184   BMET  Recent Labs  06/07/15 0508 06/08/15 0519  NA 132* 133*  K 4.5 4.7  CL 109 110  CO2 19* 19*  GLUCOSE 136* 133*  BUN 13 11  CREATININE 0.85 0.86  CALCIUM 7.6* 7.9*    Studies/Results: Dg Chest Port 1 View  06/07/2015   CLINICAL DATA:  Postoperative examination, history of new acute mesenteric ischemia, intubated patient.  EXAM: PORTABLE CHEST - 1 VIEW  COMPARISON:  Portable chest x-ray of June 06, 2015  FINDINGS: The lungs are borderline hypoinflated. There is subsegmental atelectasis in the right infrahilar region. The left lower lobe is more dense today and there is new obscuration of the left hemidiaphragm. The cardiac silhouette is mildly enlarged. The pulmonary vascularity is not engorged. The endotracheal tube tip lies 3.2 cm above the carina. The esophagogastric tube tip and proximal port lie below the GE junction. The right internal jugular Cordis sheath tip projects at the junction of the right and left brachiocephalic veins.  IMPRESSION: Interval development of bibasilar atelectasis. There is no pulmonary edema nor significant  pleural effusion. The support tubes are in reasonable position.   Electronically Signed   By: David  [place] M.D.   On: 06/07/2015 07:29   Dg Chest Port 1 View  05/28/2015   CLINICAL DATA:  Intubation.  Prior surgery.  EXAM: PORTABLE CHEST - 1 VIEW  COMPARISON:  05/22/2015.  FINDINGS: Endotracheal tube tip 4 cm above the carina. Mediastinum and hilar structures are stable. Heart size stable. Low lung volumes with bilateral subsegmental atelectasis. No pleural effusion or pneumothorax. Degenerative changes thoracic spine. Surgical staples noted over the abdomen. Surgical clips right upper quadrant.  IMPRESSION: 1. Endotracheal tube tip 4 cm above the carina. 2. Low lung volumes with mild bilateral subsegmental atelectasis .   Electronically Signed   By: Maisie Fus  Register   On: 06/10/2015 07:04   Dg Chest Port 1 View  06/05/2015   CLINICAL DATA:  Preop.  Ischemic bowel.  EXAM: PORTABLE CHEST - 1 VIEW  COMPARISON:  None.  FINDINGS: Shallow inspiration. Borderline heart size and pulmonary vascularity, likely normal for technique. No focal consolidation in the lungs. Calcified and tortuous aorta. No blunting of costophrenic angles. No pneumothorax. Degenerative changes in the spine.  IMPRESSION: No active disease.   Electronically Signed   By: Burman Nieves M.D.   On: 05/17/2015 02:14   Dg Abd Portable 1v  05/15/2015   CLINICAL DATA:  Laparoscopic cholecystectomy.  EXAM: PORTABLE ABDOMEN - 1 VIEW  COMPARISON:  CT 06/05/2015.  FINDINGS: NG tube noted with tip projected over the stomach. Surgical clips right  upper quadrant. Surgical staples over the mid abdomen. No bowel distention or free air. Degenerative changes scoliosis lumbar spine. Pelvic phleboliths. Degenerative changes both hips.  IMPRESSION: 1.  NG tube noted with tip projected over the stomach.  2. Surgical staples over the abdomen. No bowel distention. No acute abnormality identified.   Electronically Signed   By: Maisie Fus  Register   On: 06/04/2015  07:07   Anti-infectives: Anti-infectives    Start     Dose/Rate Route Frequency Ordered Stop   06/02/2015 1200  cefUROXime (ZINACEF) 1.5 g in dextrose 5 % 50 mL IVPB     1.5 g 100 mL/hr over 30 Minutes Intravenous Every 12 hours 06/05/2015 0424 06/07/15 0116   05/28/2015 0200  piperacillin-tazobactam (ZOSYN) IVPB 3.375 g     3.375 g 12.5 mL/hr over 240 Minutes Intravenous To Surgery 05/13/2015 0158 06/08/2015 0330   06/02/2015 0147  vancomycin (VANCOCIN) 1 GM/200ML IVPB    Comments:  Toney Sang   : cabinet override      05/22/2015 0147 06/05/2015 0225      Assessment/Plan: s/p Procedure(s): Exploratory Laparotomy; Thromboembolectomy of Superior Mesenteric Artery (N/A) Stable overall. Appreciate help from critical care medicine. Slow to wean vent. White count coming down. No evidence of bowel ischemia.   LOS: 2 days   Gretta Began 06/08/2015, 8:18 AM

## 2015-06-08 NOTE — Progress Notes (Addendum)
SUBJECTIVE:  Intubated and sedated  OBJECTIVE:   Vitals:   Filed Vitals:   06/08/15 0600 06/08/15 0700 06/08/15 0745 06/08/15 0801  BP: 118/42 132/71 125/68   Pulse: 104 122 101   Temp:    99.4 F (37.4 C)  TempSrc:    Axillary  Resp: 25 22 25    Height:      Weight:  150 lb 12.7 oz (68.4 kg)    SpO2: 100% 99% 100%    I&O's:   Intake/Output Summary (Last 24 hours) at 06/08/15 1007 Last data filed at 06/08/15 0700  Gross per 24 hour  Intake   2471 ml  Output    760 ml  Net   1711 ml   TELEMETRY: Reviewed telemetry pt in atrial fibrillation with RVR     PHYSICAL EXAM General:Intubated and sedated Lungs:   Clear bilaterally to auscultation anteriorly Heart:   Irregularly irregular and tachy S1 S2 Pulses are 2+ & equal. Abdomen: Bowel sounds are positive, abdomen soft and non-tender without masses Extremities:   No clubbing, cyanosis or edema.  DP +1  LABS: Basic Metabolic Panel:  Recent Labs  16/10/96 0508 06/08/15 0519  NA 132* 133*  K 4.5 4.7  CL 109 110  CO2 19* 19*  GLUCOSE 136* 133*  BUN 13 11  CREATININE 0.85 0.86  CALCIUM 7.6* 7.9*  MG 2.2 2.0  PHOS 1.9* 2.3*   Liver Function Tests:  Recent Labs  05/30/2015 0430  AST 40  ALT 12*  ALKPHOS 46  BILITOT 0.9  PROT 4.8*  ALBUMIN 2.3*    Recent Labs  05/27/2015 0430  AMYLASE 83   CBC:  Recent Labs  06/07/15 0508 06/08/15 0519  WBC 19.9* 17.7*  HGB 11.0* 9.7*  HCT 32.5* 28.8*  MCV 88.8 89.2  PLT 226 184   Cardiac Enzymes:  Recent Labs  05/26/2015 1100  TROPONINI 0.07*   BNP: Invalid input(s): POCBNP D-Dimer: No results for input(s): DDIMER in the last 72 hours. Hemoglobin A1C: No results for input(s): HGBA1C in the last 72 hours. Fasting Lipid Panel:  Recent Labs  06/09/2015 0430  TRIG 46   Thyroid Function Tests:  Recent Labs  06/07/15 1010  TSH 0.414   Anemia Panel: No results for input(s): VITAMINB12, FOLATE, FERRITIN, TIBC, IRON, RETICCTPCT in the last 72  hours. Coag Panel:   Lab Results  Component Value Date   INR 1.37 05/17/2015    RADIOLOGY: Dg Chest Port 1 View  06/08/2015   CLINICAL DATA:  Respiratory failure requiring intubation J96.90 (ICD-10-CM)  EXAM: PORTABLE CHEST - 1 VIEW  COMPARISON:  06/07/2015  FINDINGS: Lung base opacity appears mildly improved the previous day's study. Residual opacity remains consistent with atelectasis likely with small effusions. No convincing pneumonia. No pulmonary edema.  No pneumothorax.  Cardiac silhouette is top-normal in size.  Endotracheal tube is stable, tip projecting 2.8 cm above the carina. Right internal jugular introducer sheath and orogastric tube are also stable and well positioned.  IMPRESSION: 1. Mild improvement in lung base atelectasis, likely associated with small effusions. 2. No evidence of pneumonia or edema.  No pneumothorax. 3. Support apparatus remains stable and well positioned.   Electronically Signed   By: Amie Portland M.D.   On: 06/08/2015 09:37   Dg Chest Port 1 View  06/07/2015   CLINICAL DATA:  Postoperative examination, history of new acute mesenteric ischemia, intubated patient.  EXAM: PORTABLE CHEST - 1 VIEW  COMPARISON:  Portable chest x-ray of June 06, 2015  FINDINGS: The lungs are borderline hypoinflated. There is subsegmental atelectasis in the right infrahilar region. The left lower lobe is more dense today and there is new obscuration of the left hemidiaphragm. The cardiac silhouette is mildly enlarged. The pulmonary vascularity is not engorged. The endotracheal tube tip lies 3.2 cm above the carina. The esophagogastric tube tip and proximal port lie below the GE junction. The right internal jugular Cordis sheath tip projects at the junction of the right and left brachiocephalic veins.  IMPRESSION: Interval development of bibasilar atelectasis. There is no pulmonary edema nor significant pleural effusion. The support tubes are in reasonable position.   Electronically Signed    By: David  [place] M.D.   On: 06/07/2015 07:29   Dg Chest Port 1 View  06/07/2015   CLINICAL DATA:  Intubation.  Prior surgery.  EXAM: PORTABLE CHEST - 1 VIEW  COMPARISON:  06/07/2015.  FINDINGS: Endotracheal tube tip 4 cm above the carina. Mediastinum and hilar structures are stable. Heart size stable. Low lung volumes with bilateral subsegmental atelectasis. No pleural effusion or pneumothorax. Degenerative changes thoracic spine. Surgical staples noted over the abdomen. Surgical clips right upper quadrant.  IMPRESSION: 1. Endotracheal tube tip 4 cm above the carina. 2. Low lung volumes with mild bilateral subsegmental atelectasis .   Electronically Signed   By: Maisie Fus  Register   On: Jun 07, 2015 07:04   Dg Chest Port 1 View  06-07-2015   CLINICAL DATA:  Preop.  Ischemic bowel.  EXAM: PORTABLE CHEST - 1 VIEW  COMPARISON:  None.  FINDINGS: Shallow inspiration. Borderline heart size and pulmonary vascularity, likely normal for technique. No focal consolidation in the lungs. Calcified and tortuous aorta. No blunting of costophrenic angles. No pneumothorax. Degenerative changes in the spine.  IMPRESSION: No active disease.   Electronically Signed   By: Burman Nieves M.D.   On: 07-Jun-2015 02:14   Dg Abd Portable 1v  06/07/15   CLINICAL DATA:  Laparoscopic cholecystectomy.  EXAM: PORTABLE ABDOMEN - 1 VIEW  COMPARISON:  CT 06/05/2015.  FINDINGS: NG tube noted with tip projected over the stomach. Surgical clips right upper quadrant. Surgical staples over the mid abdomen. No bowel distention or free air. Degenerative changes scoliosis lumbar spine. Pelvic phleboliths. Degenerative changes both hips.  IMPRESSION: 1.  NG tube noted with tip projected over the stomach.  2. Surgical staples over the abdomen. No bowel distention. No acute abnormality identified.   Electronically Signed   By: Maisie Fus  Register   On: 06-07-15 07:07   ASSESSMENT/PLAN: 1. Atrial fibrillation with RVR - rate improved on PRN BB and  IV dig. HR currently in the 120's while trying to wake her up. Continue IV dig.  Add Lopressor  q6 hours for better rate control.  BP stable.   Would avoid IV amio with possibility of converting to NSR. In regards to long term chronic anticoagulation, she has not been on anticoagulation in the past for her chronic afib due to dementia and frequent falls. The patient is intubated and family is not present so I cannot get a feel for how unstable she is on her feet to assess her fall risk. Clearly she is at increased risk of further cardioembolic events. This patients CHA2DS2-VASc Score and unadjusted Ischemic Stroke Rate (% per year) is equal to 9.6 % stroke rate/year from a score of 7. Above score calculated as 1 point each if present [CHF, HTN, DM, Vascular=MI/PAD/Aortic Plaque, Age if 65-74, or Female] Above score calculated as 2  points each if present [Age > 75, or Stroke/TIA/TE]. Would continue IV Heparin gtt for now. Once patient is extubated and family available, will need to discuss risks going in setting of dementia and fall risk. Hopefully we can gain more info from patient on how unsteady she is on her feet and fall risk.  2. Acute mesenteric ischemia s/p expl lab with suprerior mesenteric artery thromboembolectomy. 3. HTN 4. DM 5. Abnormal EKG with anterolateral ST abnormality - check 2D echo     Quintella Reichert, MD  06/08/2015  10:07 AM

## 2015-06-08 NOTE — Plan of Care (Signed)
Problem: Problem: Respiratory Progression Goal: ABLE TO EXTUBATE Outcome: Not Met (add Reason) Patient unable to extubate at this time.

## 2015-06-08 NOTE — Progress Notes (Signed)
ANTICOAGULATION CONSULT NOTE - Follow-up Consult  Pharmacy Consult for heparin Indication: atrial fibrillation  No Known Allergies  Patient Measurements: Height: 5' 1.42" (156 cm) Weight: 147 lb 11.3 oz (67 kg) IBW/kg (Calculated) : 48.76 Heparin Dosing Weight: 63kg  Vital Signs: Temp: 99.2 F (37.3 C) (08/27 0400) Temp Source: Oral (08/27 0400) BP: 118/65 mmHg (08/27 0329) Pulse Rate: 110 (08/27 0329)  Labs:  Recent Labs  05/31/2015 0430 05/15/2015 1100 05/16/2015 1410 06/07/15 0508 06/07/15 1814 06/08/15 0519  HGB 11.5*  --  11.6* 11.0*  --  9.7*  HCT 34.5*  --  34.4* 32.5*  --  28.8*  PLT 211  --  196 226  --  184  APTT 29  --   --   --   --   --   LABPROT 17.0*  --   --   --   --   --   INR 1.37  --   --   --   --   --   HEPARINUNFRC  --   --   --   --  0.22* 0.54  CREATININE 0.75  --   --  0.85  --   --   TROPONINI  --  0.07*  --   --   --   --     Estimated Creatinine Clearance: 42.9 mL/min (by C-G formula based on Cr of 0.85).  Assessment: 81 yo female with afib (CHADSVASC= 6; age, gender, HTN, DM, vascular disease) to begin heparin at 800 units/hr per MD and pharmacy to dose beginning 06/08/15. She is noted with mesenteric ischemia due to embolus and thrombus and SMA now s/p exploratory laparotomy; thromboembolectomy of superior mesenteric artery on 8/26. Heparin level therapeutic on 800 units/hr. Hgb down to 9.7, plt down to 184.  Goal of Therapy:  Heparin level 0.3-0.7 units/ml Monitor platelets by anticoagulation protocol: Yes   Plan:  -Continue heparin at 800 units/hr -F/u heparin level in 6 hours to confirm therapeutic  Christoper Fabian, PharmD, BCPS Clinical pharmacist, pager [phone]  06/08/2015 6:05 AM

## 2015-06-08 NOTE — Progress Notes (Signed)
PT Cancellation Note  Patient Details Name: Amber Hood MRN: [id number] DOB: 11/25/1927   Cancelled Treatment:    Reason Eval/Treat Not Completed: Medical issues which prohibited therapy (Pt remains on vent will attempt 8/29 as medically appropriate)   [name] 06/08/2015, 6:58 AM Delaney Meigs, PT [phone]

## 2015-06-08 NOTE — Progress Notes (Signed)
ANTICOAGULATION CONSULT NOTE - Follow-up Consult  Pharmacy Consult for heparin Indication: atrial fibrillation  No Known Allergies  Patient Measurements: Height: 5' 1.42" (156 cm) Weight: 150 lb 12.7 oz (68.4 kg) IBW/kg (Calculated) : 48.76 Heparin Dosing Weight: 63kg  Vital Signs: Temp: 97.7 F (36.5 C) (08/27 1314) Temp Source: Axillary (08/27 1314) BP: 103/57 mmHg (08/27 1300) Pulse Rate: 94 (08/27 1300)  Labs:  Recent Labs  06/09/2015 0430 05/31/2015 1100 06/05/2015 1410 06/07/15 0508 06/07/15 1814 06/08/15 0519 06/08/15 1424  HGB 11.5*  --  11.6* 11.0*  --  9.7*  --   HCT 34.5*  --  34.4* 32.5*  --  28.8*  --   PLT 211  --  196 226  --  184  --   APTT 29  --   --   --   --   --   --   LABPROT 17.0*  --   --   --   --   --   --   INR 1.37  --   --   --   --   --   --   HEPARINUNFRC  --   --   --   --  0.22* 0.54 0.42  CREATININE 0.75  --   --  0.85  --  0.86  --   TROPONINI  --  0.07*  --   --   --   --   --     Estimated Creatinine Clearance: 42.7 mL/min (by C-G formula based on Cr of 0.86).  Assessment: 81 yo female with afib (CHADSVASC= 6; age, gender, HTN, DM, vascular disease) to begin heparin at 800 units/hr per MD and pharmacy to dose beginning 06/08/15. She is noted with mesenteric ischemia due to embolus and thrombus and SMA now s/p exploratory laparotomy; thromboembolectomy of superior mesenteric artery on 8/26. Heparin level confirmed therapeutic on 800 units/hr. Hgb down to 9.7, plt down to 184.  Goal of Therapy:  Heparin level 0.3-0.7 units/ml Monitor platelets by anticoagulation protocol: Yes   Plan:  -Continue heparin at 800 units/hr -Daily heparin level and CBC  Amber Hood, Pharm D 06/08/2015 3:37 PM

## 2015-06-08 NOTE — Progress Notes (Signed)
PULMONARY / CRITICAL CARE MEDICINE   Name: Amber Hood MRN: [id number] DOB: [birth year]    ADMISSION DATE:  05/18/2015 CONSULTATION DATE: 8/25  REFERRING MD :  Hart Rochester  CHIEF COMPLAINT:  Abd pain  INITIAL PRESENTATION: To [facility] with abd pain  HISTORY OF PRESENT ILLNESS:   81 yo AAM with history of dementia/falls, Afib but not on anticoagulation due to falls, who presented to [facility] 8/24 with acute abd pain. She was transferred to [facility] and to Care of Vascular surgery for exp lap with superior mesenteric thrombectomy. She returned from OR to SICU and PCCM asked to assist in post op management.  STUDIES:    SIGNIFICANT EVENTS: 8/25 p lap mesenteric thrombus  SUBJECTIVE: remains intubated   UO picking up  VITAL SIGNS: Temp:  [98.2 F (36.8 C)-99.7 F (37.6 C)] 99.4 F (37.4 C) (08/27 0801) Pulse Rate:  [26-131] 122 (08/27 0700) Resp:  [13-35] 22 (08/27 0700) BP: (85-143)/(42-92) 132/71 mmHg (08/27 0700) SpO2:  [85 %-100 %] 99 % (08/27 0700) Arterial Line BP: (87-165)/(50-88) 121/68 mmHg (08/27 0700) FiO2 (%):  [40 %] 40 % (08/27 0329) Weight:  [150 lb 12.7 oz (68.4 kg)] 150 lb 12.7 oz (68.4 kg) (08/27 0700) HEMODYNAMICS: CVP:  [4 mmHg-10 mmHg] 4 mmHg VENTILATOR SETTINGS: Vent Mode:  [-] PRVC FiO2 (%):  [40 %] 40 % Set Rate:  [12 bmp] 12 bmp Vt Set:  [400 mL] 400 mL PEEP:  [5 cmH20] 5 cmH20 Pressure Support:  [15 cmH20] 15 cmH20 Plateau Pressure:  [14 cmH20-21 cmH20] 19 cmH20 INTAKE / OUTPUT:  Intake/Output Summary (Last 24 hours) at 06/08/15 0857 Last data filed at 06/08/15 0700  Gross per 24 hour  Intake 2714.3 ml  Output    810 ml  Net 1904.3 ml    PHYSICAL EXAMINATION: General:  Frail, elderly AAF sedated on vent Neuro: Sedated on vent, opens eyes to command HEENT:  No jvd/lan Cardiovascular:  HSIR Afib prn lopressor  Lungs: Decreased bs bases Abdomen: Dressing CDI Musculoskeletal: Intact Skin: cool and dry  LABS:  CBC  Recent Labs Lab  05/21/2015 1410 06/07/15 0508 06/08/15 0519  WBC 16.5* 19.9* 17.7*  HGB 11.6* 11.0* 9.7*  HCT 34.4* 32.5* 28.8*  PLT 196 226 184   Coag's  Recent Labs Lab 05/22/2015 0430  APTT 29  INR 1.37   BMET  Recent Labs Lab 05/31/2015 0430 06/07/15 0508 06/08/15 0519  NA 138 132* 133*  K 3.3* 4.5 4.7  CL 109 109 110  CO2 19* 19* 19*  BUN 14 13 11   CREATININE 0.75 0.85 0.86  GLUCOSE 242* 136* 133*   Electrolytes  Recent Labs Lab 05/30/2015 0430 06/07/15 0508 06/08/15 0519  CALCIUM 7.7* 7.6* 7.9*  MG 1.1* 2.2 2.0  PHOS  --  1.9* 2.3*   Sepsis Markers  Recent Labs Lab 06/01/2015 1100 06/07/15 2238  LATICACIDVEN 3.0* 1.8   ABG  Recent Labs Lab 05/19/2015 0950 05/16/2015 1359 06/08/15 0625  PHART 7.334* 7.336* 7.411  PCO2ART 33.3* 34.2* 28.5*  PO2ART 181.0* 118.0* 114*   Liver Enzymes  Recent Labs Lab 05/13/2015 0430  AST 40  ALT 12*  ALKPHOS 46  BILITOT 0.9  ALBUMIN 2.3*   Cardiac Enzymes  Recent Labs Lab 06/09/2015 1100  TROPONINI 0.07*   Glucose  Recent Labs Lab 06/07/15 1119 06/07/15 1518 06/07/15 2012 06/08/15 0003 06/08/15 0424 06/08/15 0759  GLUCAP 108* 100* 105* 121* 127* 107*    Imaging No results found.   ASSESSMENT / PLAN:  PULMONARY OETT  8/25>> A: VDRF post lap P:   Vent bundle SBTs with goal extubation, when awake enough   CARDIOVASCULAR CVL 8/25 rt i j >> A:  Post op hypotension, resolved Hx of HTN AF -RVR - deemed not a candidate for anticoagulation due to falls but on heparin per surgery P:  Neo gtt CVP goal 10 & above Short term heparin ok - not sure can use this long term  RENAL A:  Hypokalemia Hypophos P:   Replete lytes as needed   GASTROINTESTINAL A:  Post lap Concern for ischemic bowel P:   PPI Npo -start when ok with vascular. Needs TPN or trickle tf.  HEMATOLOGIC A:   No acute issue P:    INFECTIOUS A:   Post lap P:    Abx:  8/25 zoysn>>8/25 8/25 zinacef>>8/26  ENDOCRINE A:    DM P:   SSI  NEUROLOGIC A:   Hx of dementia  P:   RASS goal:0 Fent prn Try to avoid propofol   FAMILY  - Updates:   - Inter-disciplinary family meet or Palliative Care meeting due by:  day     TODAY'S SUMMARY:   81 yo AAM with history of dementia/falls, Afib but not on anticoagulation due to falls, who presented to [facility] 8/24 with acute abd pain. She  underwentexp lap with superior mesenteric thrombectomy. Extubation delayed due to tachy & hypotension with concern for ischemic bowel in the background. Note now on heparin drip. Not currently weanable. Off diprivan for now.  Brett Canales Minor ACNP Ad[facility] Pollack PCCM Pager [phone] till 3 pm If no answer page [phone] 06/08/2015, 8:58 AM

## 2015-06-08 NOTE — Progress Notes (Signed)
STAFF NOTE: I, Dr Lavinia Sharps have personally reviewed patient's available data, including medical history, events of note, physical examination and test results as part of my evaluation. I have discussed with resident/NP and other care providers such as pharmacist, RN and RRT.  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 She is off fent gtt but needing fent prn due to post op pain per RN. Otehrwise calm without issues. Off pressors   O: Debilitated looking female Intubated Resting comfortable But extreme incisional tendernss of abd palpation   CXR - et tube ok - personally vislualized Blood labs  - phos mildley low  A: Acute resp failure - clinically not an extubation candidate Hypophostaemia - improved but still low Post op pain  P: Full vent support Replete phos Fent prn - increase dose range   .  Rest per NP/medical resident whose note is outlined above and that I agree with  The patient is critically ill with multiple organ systems failure and requires high complexity decision making for assessment and support, frequent evaluation and titration of therapies, application of advanced monitoring technologies and extensive interpretation of multiple databases.   Critical Care Time devoted to patient care services described in this note is  30  Minutes. This time reflects time of care of this signee Dr Kalman Shan. This critical care time does not reflect procedure time, or teaching time or supervisory time of PA/NP/Med student/Med Resident etc but could involve care discussion time    Dr. Kalman Shan, M.D., [facility].C.P Pulmonary and Critical Care Medicine Staff  Physician [facility] [place] [facility] Pager: [phone], If no answer or between  15:00h - 7:00h: call [phone]  06/08/2015 12:10 PM

## 2015-06-09 ENCOUNTER — Inpatient Hospital Stay (HOSPITAL_COMMUNITY): Payer: Medicare (Managed Care)

## 2015-06-09 DIAGNOSIS — Z01810 Encounter for preprocedural cardiovascular examination: Secondary | ICD-10-CM

## 2015-06-09 LAB — GLUCOSE, CAPILLARY
GLUCOSE-CAPILLARY: 112 mg/dL — AB (ref 65–99)
GLUCOSE-CAPILLARY: 117 mg/dL — AB (ref 65–99)
Glucose-Capillary: 118 mg/dL — ABNORMAL HIGH (ref 65–99)
Glucose-Capillary: 122 mg/dL — ABNORMAL HIGH (ref 65–99)
Glucose-Capillary: 124 mg/dL — ABNORMAL HIGH (ref 65–99)
Glucose-Capillary: 128 mg/dL — ABNORMAL HIGH (ref 65–99)

## 2015-06-09 LAB — BASIC METABOLIC PANEL
Anion gap: 3 — ABNORMAL LOW (ref 5–15)
BUN: 8 mg/dL (ref 6–20)
CALCIUM: 8.3 mg/dL — AB (ref 8.9–10.3)
CHLORIDE: 112 mmol/L — AB (ref 101–111)
CO2: 20 mmol/L — ABNORMAL LOW (ref 22–32)
CREATININE: 0.8 mg/dL (ref 0.44–1.00)
GFR calc non Af Amer: >60 mL/min (ref >60)
Glucose, Bld: 133 mg/dL — ABNORMAL HIGH (ref 65–99)
Potassium: 4.8 mmol/L (ref 3.5–5.1)
SODIUM: 135 mmol/L (ref 135–145)

## 2015-06-09 LAB — PHOSPHORUS: PHOSPHORUS: 1.9 mg/dL — AB (ref 2.5–4.6)

## 2015-06-09 LAB — CBC
HEMATOCRIT: 26.9 % — AB (ref 36.0–46.0)
Hemoglobin: 9 g/dL — ABNORMAL LOW (ref 12.0–15.0)
MCH: 30.6 pg (ref 26.0–34.0)
MCHC: 33.5 g/dL (ref 30.0–36.0)
MCV: 91.5 fL (ref 78.0–100.0)
PLATELETS: 190 10*3/uL (ref 150–400)
RBC: 2.94 MIL/uL — ABNORMAL LOW (ref 3.87–5.11)
RDW: 13.3 % (ref 11.5–15.5)
WBC: 14.3 10*3/uL — AB (ref 4.0–10.5)

## 2015-06-09 LAB — HEPARIN LEVEL (UNFRACTIONATED)
HEPARIN UNFRACTIONATED: 0.29 [IU]/mL — AB (ref 0.30–0.70)
HEPARIN UNFRACTIONATED: 0.47 [IU]/mL (ref 0.30–0.70)

## 2015-06-09 LAB — MAGNESIUM: Magnesium: 2.1 mg/dL (ref 1.7–2.4)

## 2015-06-09 MED ORDER — METOPROLOL TARTRATE 1 MG/ML IV SOLN
5.0000 mg | INTRAVENOUS | Status: DC
Start: 2015-06-09 — End: 2015-06-16
  Administered 2015-06-09 – 2015-06-16 (×39): 5 mg via INTRAVENOUS
  Filled 2015-06-09 (×47): qty 5

## 2015-06-09 MED ORDER — SODIUM PHOSPHATE 3 MMOLE/ML IV SOLN
10.0000 mmol | Freq: Once | INTRAVENOUS | Status: AC
  Administered 2015-06-09: 10 mmol via INTRAVENOUS
  Filled 2015-06-09: qty 3.33

## 2015-06-09 NOTE — Progress Notes (Signed)
  Echocardiogram 2D Echocardiogram has been performed.  Delcie Roch 06/09/2015, 2:56 PM

## 2015-06-09 NOTE — Progress Notes (Signed)
Attempted to wean PSV to 10, vT dropped to 100 ml, RR 38-40bpm.  RN aware.

## 2015-06-09 NOTE — Progress Notes (Signed)
ANTICOAGULATION CONSULT NOTE - Follow-up Consult  Pharmacy Consult for heparin Indication: atrial fibrillation  No Known Allergies  Patient Measurements: Height: 5' 1.42" (156 cm) Weight: 150 lb 12.7 oz (68.4 kg) IBW/kg (Calculated) : 48.76 Heparin Dosing Weight: 63kg  Vital Signs: Temp: 99.2 F (37.3 C) (08/28 0421) Temp Source: Axillary (08/28 0421) BP: 125/56 mmHg (08/28 0400) Pulse Rate: 86 (08/28 0100)  Labs:  Recent Labs  06/05/2015 1100  06/07/15 0508  06/08/15 0519 06/08/15 1424 06/09/15 0431  HGB  --   < > 11.0*  --  9.7*  --  9.0*  HCT  --   < > 32.5*  --  28.8*  --  26.9*  PLT  --   < > 226  --  184  --  190  HEPARINUNFRC  --   --   --   < > 0.54 0.42 0.29*  CREATININE  --   --  0.85  --  0.86  --   --   TROPONINI 0.07*  --   --   --   --   --   --   < > = values in this interval not displayed.  Estimated Creatinine Clearance: 42.7 mL/min (by C-G formula based on Cr of 0.86).  Assessment: 81 yo female with afib (CHADSVASC= 6; age, gender, HTN, DM, vascular disease) continues on heparin at 800 units/hr. Heparin level down to 0.29 (slightly subtherapeutic). Hgb 9, plt 190. No bleeding noted.  Goal of Therapy:  Heparin level 0.3-0.7 units/ml Monitor platelets by anticoagulation protocol: Yes   Plan:  -Increase heparin to 900 units/hr -F/u 8 hr heparin level  Christoper Fabian, PharmD, BCPS Clinical pharmacist, pager [phone]  06/09/2015 4:57 AM

## 2015-06-09 NOTE — Progress Notes (Signed)
PULMONARY / CRITICAL CARE MEDICINE   Name: Amber Hood MRN: [id number] DOB: 1927/10/30    ADMISSION DATE:  06/03/2015 CONSULTATION DATE: 8/25  REFERRING MD :  Hart Rochester  CHIEF COMPLAINT:  Abd pain  INITIAL PRESENTATION: To [facility] with abd pain  HISTORY OF PRESENT ILLNESS:   81 yo AAM with history of dementia/falls, Afib but not on anticoagulation due to falls, who presented to [facility] 8/24 with acute abd pain. She was transferred to [facility] and to Care of Vascular surgery for exp lap with superior mesenteric thrombectomy. She returned from OR to SICU and PCCM asked to assist in post op management.  STUDIES:    SIGNIFICANT EVENTS: 8/25 p lap mesenteric thrombus  SUBJECTIVE: remains intubated, Vt less than 200 cc     VITAL SIGNS: Temp:  [96.8 F (36 C)-99.2 F (37.3 C)] 98 F (36.7 C) (08/28 0818) Pulse Rate:  [32-120] 86 (08/28 0100) Resp:  [12-23] 15 (08/28 0400) BP: (96-144)/(35-87) 125/56 mmHg (08/28 0400) SpO2:  [99 %-100 %] 99 % (08/28 0100) Arterial Line BP: (91-143)/(46-72) 121/58 mmHg (08/28 0400) FiO2 (%):  [40 %] 40 % (08/28 0422) HEMODYNAMICS:   VENTILATOR SETTINGS: Vent Mode:  [-] PRVC FiO2 (%):  [40 %] 40 % Set Rate:  [12 bmp] 12 bmp Vt Set:  [400 mL] 400 mL PEEP:  [5 cmH20] 5 cmH20 Plateau Pressure:  [15 cmH20-20 cmH20] 20 cmH20 INTAKE / OUTPUT:  Intake/Output Summary (Last 24 hours) at 06/09/15 0846 Last data filed at 06/09/15 0400  Gross per 24 hour  Intake   2538 ml  Output   1060 ml  Net   1478 ml    PHYSICAL EXAMINATION: General:  Frail, elderly AAF sedated on vent Neuro: Sedated on vent, no follows commands HEENT:  No jvd/lan Cardiovascular:  HSIR Afib prn lopressor , heparin drip Lungs: Decreased bs bases Abdomen:midline staple line clean Musculoskeletal: Intact Skin: cool and dry  LABS:  CBC  Recent Labs Lab 06/07/15 0508 06/08/15 0519 06/09/15 0431  WBC 19.9* 17.7* 14.3*  HGB 11.0* 9.7* 9.0*  HCT 32.5* 28.8* 26.9*  PLT 226  184 190   Coag's  Recent Labs Lab 05/24/2015 0430  APTT 29  INR 1.37   BMET  Recent Labs Lab 06/07/15 0508 06/08/15 0519 06/09/15 0431  NA 132* 133* 135  K 4.5 4.7 4.8  CL 109 110 112*  CO2 19* 19* 20*  BUN CREATININE 0.85 0.86 0.80  GLUCOSE 136* 133* 133*   Electrolytes  Recent Labs Lab 06/07/15 0508 06/08/15 0519 06/09/15 0431  CALCIUM 7.6* 7.9* 8.3*  MG 2.2 2.0 2.1  PHOS 1.9* 2.3* 1.9*   Sepsis Markers  Recent Labs Lab 06/01/2015 1100 06/07/15 2238  LATICACIDVEN 3.0* 1.8   ABG  Recent Labs Lab 05/23/2015 0950 05/21/2015 1359 06/08/15 0625  PHART 7.334* 7.336* 7.411  PCO2ART 33.3* 34.2* 28.5*  PO2ART 181.0* 118.0* 114*   Liver Enzymes  Recent Labs Lab 05/21/2015 0430  AST 40  ALT 12*  ALKPHOS 46  BILITOT 0.9  ALBUMIN 2.3*   Cardiac Enzymes  Recent Labs Lab 06/11/2015 1100  TROPONINI 0.07*   Glucose  Recent Labs Lab 06/08/15 1302 06/08/15 1628 06/08/15 2021 06/09/15 06/09/15 0419 06/09/15 0815  GLUCAP 112* 97 115* 124* 122* 112*    Imaging No results found.   ASSESSMENT / PLAN:  PULMONARY OETT 8/25>> A: VDRF post lap P:   Vent bundle SBTs with goal extubation, when awake enough   CARDIOVASCULAR CVL 8/25 rt  i j >> A:  Post op hypotension, resolved Hx of HTN AF -RVR - deemed not a candidate for anticoagulation due to falls but on heparin per surgery P:  Neo gtt CVP goal 10 & above Short term heparin ok - not sure can use this long term  RENAL A:  Hypokalemia Hypophos P:   Replete lytes as needed   GASTROINTESTINAL A:  Post lap Concern for ischemic bowel P:   PPI Npo -start when ok with vascular. Needs TPN or trickle tf. Surgery to decide in am  HEMATOLOGIC A:   No acute issue P:    INFECTIOUS A:   Post lap P:    Abx:  8/25 zoysn>>8/25 8/25 zinacef>>8/26  ENDOCRINE A:   DM P:   SSI  NEUROLOGIC A:   Hx of dementia  P:   RASS goal:0 Fent prn Try to avoid  propofol   FAMILY  - Updates:   - Inter-disciplinary family meet or Palliative Care meeting due by:  day     TODAY'S SUMMARY:   81 yo AAM with history of dementia/falls, Afib but not on anticoagulation due to falls, who presented to [facility] 8/24 with acute abd pain. She  underwentexp lap with superior mesenteric thrombectomy. Extubation delayed due to tachy & hypotension with concern for ischemic bowel in the background. Note now on heparin drip. Not currently weanable. Vt to small and mental status lethargic  Brett Canales Carolee Channell ACNP Adolph Pollack PCCM Pager [phone] till 3 pm If no answer page [phone] 06/09/2015, 8:46 AM

## 2015-06-09 NOTE — Progress Notes (Signed)
ANTICOAGULATION CONSULT NOTE - Follow-up Consult  Pharmacy Consult for heparin Indication: atrial fibrillation  No Known Allergies  Patient Measurements: Height: 5' 1.42" (156 cm) Weight: 150 lb 12.7 oz (68.4 kg) IBW/kg (Calculated) : 48.76 Heparin Dosing Weight: 63kg  Vital Signs: Temp: 98.8 F (37.1 C) (08/28 1542) Temp Source: Axillary (08/28 1542) BP: 132/53 mmHg (08/28 1505) Pulse Rate: 96 (08/28 1505)  Labs:  Recent Labs  06/07/15 0508  06/08/15 0519 06/08/15 1424 06/09/15 0431 06/09/15 1550  HGB 11.0*  --  9.7*  --  9.0*  --   HCT 32.5*  --  28.8*  --  26.9*  --   PLT 226  --  184  --  190  --   HEPARINUNFRC  --   < > 0.54 0.42 0.29* 0.47  CREATININE 0.85  --  0.86  --  0.80  --   < > = values in this interval not displayed.  Estimated Creatinine Clearance: 45.9 mL/min (by C-G formula based on Cr of 0.8).  Assessment: 81 yo female with afib (CHADSVASC= 6; age, gender, HTN, DM, vascular disease) continues on heparin at 800 units/hr. Heparin level at goal (HL= 0.47)after increase to 900 units/hr  Goal of Therapy:  Heparin level 0.3-0.7 units/ml Monitor platelets by anticoagulation protocol: Yes   Plan:  -No heparin changes needed -Daily heparin level and CBC  Harland German, Pharm D 06/09/2015 4:25 PM

## 2015-06-09 NOTE — Plan of Care (Signed)
Problem: Problem: Respiratory Progression Goal: ABLE TO EXTUBATE Outcome: Not Met (add Reason) Patient still unable to extubate.

## 2015-06-09 NOTE — Progress Notes (Addendum)
SUBJECTIVE:  Sedated and intubated  OBJECTIVE:   Vitals:   Filed Vitals:   06/09/15 0400 06/09/15 0421 06/09/15 0810 06/09/15 0818  BP: 125/56  134/75   Pulse:   109   Temp:  99.2 F (37.3 C)  98 F (36.7 C)  TempSrc:  Axillary  Axillary  Resp: 15  28   Height:      Weight:      SpO2:   98%    I&O's:   Intake/Output Summary (Last 24 hours) at 06/09/15 1002 Last data filed at 06/09/15 0400  Gross per 24 hour  Intake   2302 ml  Output   1060 ml  Net   1242 ml   TELEMETRY: Reviewed telemetry pt in atrial fibrillation with RVR     PHYSICAL EXAM General: intubated and sedated Lungs:   Clear bilaterally to auscultation and percussion. Heart:   Irregularly irregular and tachy S1 S2 Pulses are 2+ & equal. Abdomen: Bowel sounds are positive, abdomen soft and non-tender without masses  Extremities:   No clubbing, cyanosis or edema.  DP +1   LABS: Basic Metabolic Panel:  Recent Labs  24/40/10 0519 06/09/15 0431  NA 133* 135  K 4.7 4.8  CL 110 112*  CO2 19* 20*  GLUCOSE 133* 133*  BUN 11 8  CREATININE 0.86 0.80  CALCIUM 7.9* 8.3*  MG 2.0 2.1  PHOS 2.3* 1.9*   Liver Function Tests: No results for input(s): AST, ALT, ALKPHOS, BILITOT, PROT, ALBUMIN in the last 72 hours. No results for input(s): LIPASE, AMYLASE in the last 72 hours. CBC:  Recent Labs  06/08/15 0519 06/09/15 0431  WBC 17.7* 14.3*  HGB 9.7* 9.0*  HCT 28.8* 26.9*  MCV 89.2 91.5  PLT 184 190   Cardiac Enzymes:  Recent Labs  05/24/2015 1100  TROPONINI 0.07*   BNP: Invalid input(s): POCBNP D-Dimer: No results for input(s): DDIMER in the last 72 hours. Hemoglobin A1C: No results for input(s): HGBA1C in the last 72 hours. Fasting Lipid Panel: No results for input(s): CHOL, HDL, LDLCALC, TRIG, CHOLHDL, LDLDIRECT in the last 72 hours. Thyroid Function Tests:  Recent Labs  06/07/15 1010  TSH 0.414   Anemia Panel: No results for input(s): VITAMINB12, FOLATE, FERRITIN, TIBC, IRON,  RETICCTPCT in the last 72 hours. Coag Panel:   Lab Results  Component Value Date   INR 1.37 05/17/2015    RADIOLOGY: Dg Chest Port 1 View  06/09/2015   CLINICAL DATA:  Respiratory failure and shortness of breath  EXAM: PORTABLE CHEST - 1 VIEW  COMPARISON:  1 day prior  FINDINGS: Patient rotated right. Endotracheal tube terminates 3.0 cm above carina. Nasogastric tube extends beyond the inferior aspect of the film. Right internal jugular line tip high SVC. Numerous leads and wires project over the chest. Cardiomegaly accentuated by AP portable technique. Small left pleural effusion, similar. No pneumothorax. Minimal right infrahilar volume loss. Dense left lower lobe airspace disease is persistent.  IMPRESSION: Similar left base atelectasis or pneumonia with adjacent small left pleural effusion.  Cardiomegaly without congestive failure.   Electronically Signed   By: Jeronimo Greaves M.D.   On: 06/09/2015 08:53   Dg Chest Port 1 View  06/08/2015   CLINICAL DATA:  Respiratory failure requiring intubation J96.90 (ICD-10-CM)  EXAM: PORTABLE CHEST - 1 VIEW  COMPARISON:  06/07/2015  FINDINGS: Lung base opacity appears mildly improved the previous day's study. Residual opacity remains consistent with atelectasis likely with small effusions. No convincing pneumonia. No pulmonary  edema.  No pneumothorax.  Cardiac silhouette is top-normal in size.  Endotracheal tube is stable, tip projecting 2.8 cm above the carina. Right internal jugular introducer sheath and orogastric tube are also stable and well positioned.  IMPRESSION: 1. Mild improvement in lung base atelectasis, likely associated with small effusions. 2. No evidence of pneumonia or edema.  No pneumothorax. 3. Support apparatus remains stable and well positioned.   Electronically Signed   By: Amie Portland M.D.   On: 06/08/2015 09:37   Dg Chest Port 1 View  06/07/2015   CLINICAL DATA:  Postoperative examination, history of new acute mesenteric ischemia,  intubated patient.  EXAM: PORTABLE CHEST - 1 VIEW  COMPARISON:  Portable chest x-ray of June 06, 2015  FINDINGS: The lungs are borderline hypoinflated. There is subsegmental atelectasis in the right infrahilar region. The left lower lobe is more dense today and there is new obscuration of the left hemidiaphragm. The cardiac silhouette is mildly enlarged. The pulmonary vascularity is not engorged. The endotracheal tube tip lies 3.2 cm above the carina. The esophagogastric tube tip and proximal port lie below the GE junction. The right internal jugular Cordis sheath tip projects at the junction of the right and left brachiocephalic veins.  IMPRESSION: Interval development of bibasilar atelectasis. There is no pulmonary edema nor significant pleural effusion. The support tubes are in reasonable position.   Electronically Signed   By: David  [place] M.D.   On: 06/07/2015 07:29   Dg Chest Port 1 View  06/01/2015   CLINICAL DATA:  Intubation.  Prior surgery.  EXAM: PORTABLE CHEST - 1 VIEW  COMPARISON:  05/29/2015.  FINDINGS: Endotracheal tube tip 4 cm above the carina. Mediastinum and hilar structures are stable. Heart size stable. Low lung volumes with bilateral subsegmental atelectasis. No pleural effusion or pneumothorax. Degenerative changes thoracic spine. Surgical staples noted over the abdomen. Surgical clips right upper quadrant.  IMPRESSION: 1. Endotracheal tube tip 4 cm above the carina. 2. Low lung volumes with mild bilateral subsegmental atelectasis .   Electronically Signed   By: Maisie Fus  Register   On: 05/14/2015 07:04   Dg Chest Port 1 View  05/29/2015   CLINICAL DATA:  Preop.  Ischemic bowel.  EXAM: PORTABLE CHEST - 1 VIEW  COMPARISON:  None.  FINDINGS: Shallow inspiration. Borderline heart size and pulmonary vascularity, likely normal for technique. No focal consolidation in the lungs. Calcified and tortuous aorta. No blunting of costophrenic angles. No pneumothorax. Degenerative changes in the  spine.  IMPRESSION: No active disease.   Electronically Signed   By: Burman Nieves M.D.   On: 06/09/2015 02:14   Dg Abd Portable 1v  06/05/2015   CLINICAL DATA:  Laparoscopic cholecystectomy.  EXAM: PORTABLE ABDOMEN - 1 VIEW  COMPARISON:  CT 06/05/2015.  FINDINGS: NG tube noted with tip projected over the stomach. Surgical clips right upper quadrant. Surgical staples over the mid abdomen. No bowel distention or free air. Degenerative changes scoliosis lumbar spine. Pelvic phleboliths. Degenerative changes both hips.  IMPRESSION: 1.  NG tube noted with tip projected over the stomach.  2. Surgical staples over the abdomen. No bowel distention. No acute abnormality identified.   Electronically Signed   By: Maisie Fus  Register   On: 06/04/2015 07:07     ASSESSMENT/PLAN: 1. Atrial fibrillation with RVR - rate improved on BB and IV dig. HR currently in the 105-115bpm while trying to wake her up. Continue IV dig. Increase Lopressor 5mg  q4 hours for better rate control. BP stable.  Would avoid IV amio with possibility of converting to NSR. In regards to long term chronic anticoagulation, she has not been on anticoagulation in the past for her chronic afib due to dementia and frequent falls. The patient is intubated and family is not present so I cannot get a feel for how unstable she is on her feet to assess her fall risk. Clearly she is at increased risk of further cardioembolic events. This patients CHA2DS2-VASc Score and unadjusted Ischemic Stroke Rate (% per year) is equal to 9.6 % stroke rate/year from a score of 7. Above score calculated as 1 point each if present [CHF, HTN, DM, Vascular=MI/PAD/Aortic Plaque, Age if 65-74, or Female] Above score calculated as 2 points each if present [Age > 75, or Stroke/TIA/TE]. Would continue IV Heparin gtt for now. Once patient is extubated and family available, will need to discuss risks going in setting of dementia and fall risk. Hopefully we can gain more  info from patient on how unsteady she is on her feet and fall risk.  2. Acute mesenteric ischemia s/p expl lab with suprerior mesenteric artery thromboembolectomy. 3. HTN 4. DM 5. Abnormal EKG with anterolateral ST abnormality - check 2D echo  Quintella Reichert, MD  06/09/2015  10:02 AM

## 2015-06-09 NOTE — Progress Notes (Signed)
Subjective: Interval History: none.. Comfortable on vent.  Objective: Vital signs in last 24 hours: Temp:  [96.8 F (36 C)-99.2 F (37.3 C)] 99.2 F (37.3 C) (08/28 0421) Pulse Rate:  [32-120] 86 (08/28 0100) Resp:  [12-23] 15 (08/28 0400) BP: (96-144)/(35-87) 125/56 mmHg (08/28 0400) SpO2:  [99 %-100 %] 99 % (08/28 0100) Arterial Line BP: (91-143)/(46-72) 121/58 mmHg (08/28 0400) FiO2 (%):  [40 %] 40 % (08/28 0422)  Intake/Output from previous day: 08/27 0701 - 08/28 0700 In: 2824 [I.V.:2526; NG/GT:130; IV Piggyback:168] Out: 1110 [Urine:1110] Intake/Output this shift:    Dressing removed. Incision healing nicely. Abdomen soft. Seems to have mild discomfort on palpation.  Lab Results:  Recent Labs  06/08/15 0519 06/09/15 0431  WBC 17.7* 14.3*  HGB 9.7* 9.0*  HCT 28.8* 26.9*  PLT 184 190   BMET  Recent Labs  06/08/15 0519 06/09/15 0431  NA 133* 135  K 4.7 4.8  CL 110 112*  CO2 19* 20*  GLUCOSE 133* 133*  BUN 11 8  CREATININE 0.86 0.80  CALCIUM 7.9* 8.3*    Studies/Results: Dg Chest Port 1 View  06/08/2015   CLINICAL DATA:  Respiratory failure requiring intubation J96.90 (ICD-10-CM)  EXAM: PORTABLE CHEST - 1 VIEW  COMPARISON:  06/07/2015  FINDINGS: Lung base opacity appears mildly improved the previous day's study. Residual opacity remains consistent with atelectasis likely with small effusions. No convincing pneumonia. No pulmonary edema.  No pneumothorax.  Cardiac silhouette is top-normal in size.  Endotracheal tube is stable, tip projecting 2.8 cm above the carina. Right internal jugular introducer sheath and orogastric tube are also stable and well positioned.  IMPRESSION: 1. Mild improvement in lung base atelectasis, likely associated with small effusions. 2. No evidence of pneumonia or edema.  No pneumothorax. 3. Support apparatus remains stable and well positioned.   Electronically Signed   By: Amie Portland M.D.   On: 06/08/2015 09:37   Dg Chest Port 1  View  06/07/2015   CLINICAL DATA:  Postoperative examination, history of new acute mesenteric ischemia, intubated patient.  EXAM: PORTABLE CHEST - 1 VIEW  COMPARISON:  Portable chest x-ray of 06-17-15  FINDINGS: The lungs are borderline hypoinflated. There is subsegmental atelectasis in the right infrahilar region. The left lower lobe is more dense today and there is new obscuration of the left hemidiaphragm. The cardiac silhouette is mildly enlarged. The pulmonary vascularity is not engorged. The endotracheal tube tip lies 3.2 cm above the carina. The esophagogastric tube tip and proximal port lie below the GE junction. The right internal jugular Cordis sheath tip projects at the junction of the right and left brachiocephalic veins.  IMPRESSION: Interval development of bibasilar atelectasis. There is no pulmonary edema nor significant pleural effusion. The support tubes are in reasonable position.   Electronically Signed   By: David  [place] M.D.   On: 06/07/2015 07:29   Dg Chest Port 1 View  2015-06-17   CLINICAL DATA:  Intubation.  Prior surgery.  EXAM: PORTABLE CHEST - 1 VIEW  COMPARISON:  2015-06-17.  FINDINGS: Endotracheal tube tip 4 cm above the carina. Mediastinum and hilar structures are stable. Heart size stable. Low lung volumes with bilateral subsegmental atelectasis. No pleural effusion or pneumothorax. Degenerative changes thoracic spine. Surgical staples noted over the abdomen. Surgical clips right upper quadrant.  IMPRESSION: 1. Endotracheal tube tip 4 cm above the carina. 2. Low lung volumes with mild bilateral subsegmental atelectasis .   Electronically Signed   By: Maisie Fus  Register   On: 06/07/2015 07:04   Dg Chest Port 1 View  05/16/2015   CLINICAL DATA:  Preop.  Ischemic bowel.  EXAM: PORTABLE CHEST - 1 VIEW  COMPARISON:  None.  FINDINGS: Shallow inspiration. Borderline heart size and pulmonary vascularity, likely normal for technique. No focal consolidation in the lungs. Calcified  and tortuous aorta. No blunting of costophrenic angles. No pneumothorax. Degenerative changes in the spine.  IMPRESSION: No active disease.   Electronically Signed   By: Burman Nieves M.D.   On: 05/20/2015 02:14   Dg Abd Portable 1v  06/12/2015   CLINICAL DATA:  Laparoscopic cholecystectomy.  EXAM: PORTABLE ABDOMEN - 1 VIEW  COMPARISON:  CT 06/05/2015.  FINDINGS: NG tube noted with tip projected over the stomach. Surgical clips right upper quadrant. Surgical staples over the mid abdomen. No bowel distention or free air. Degenerative changes scoliosis lumbar spine. Pelvic phleboliths. Degenerative changes both hips.  IMPRESSION: 1.  NG tube noted with tip projected over the stomach.  2. Surgical staples over the abdomen. No bowel distention. No acute abnormality identified.   Electronically Signed   By: Maisie Fus  Register   On: 05/16/2015 07:07   Anti-infectives: Anti-infectives    Start     Dose/Rate Route Frequency Ordered Stop   05/23/2015 1200  cefUROXime (ZINACEF) 1.5 g in dextrose 5 % 50 mL IVPB     1.5 g 100 mL/hr over 30 Minutes Intravenous Every 12 hours 06/02/2015 0424 06/07/15 0116   05/28/2015 0200  piperacillin-tazobactam (ZOSYN) IVPB 3.375 g     3.375 g 12.5 mL/hr over 240 Minutes Intravenous To Surgery 05/15/2015 0158 05/28/2015 0330   05/15/2015 0147  vancomycin (VANCOCIN) 1 GM/200ML IVPB    Comments:  Toney Sang   : cabinet override      06/03/2015 0147 06/09/2015 0225      Assessment/Plan: s/p Procedure(s): Exploratory Laparotomy; Thromboembolectomy of Superior Mesenteric Artery (N/A) Postop day 3 from SMA embolectomy and exploratory laparotomy. [place] the difficulty in weaning. Apparently was making progress yesterday but the been fatigued after narcotic pain medication. Would hold off on tube feedings since she is only 3 days out with an expected ileus from her ischemia. Could potentially begin tomorrow.   LOS: 3 days   Early, Todd 06/09/2015, 8:10 AM

## 2015-06-09 NOTE — Progress Notes (Signed)
RN notified me of pt getting ready for procedure and pt to receive sedation- requested that pt be placed back on full vent support.

## 2015-06-10 ENCOUNTER — Inpatient Hospital Stay (HOSPITAL_COMMUNITY): Payer: Medicare (Managed Care)

## 2015-06-10 DIAGNOSIS — I4891 Unspecified atrial fibrillation: Secondary | ICD-10-CM

## 2015-06-10 LAB — HEPARIN LEVEL (UNFRACTIONATED)
HEPARIN UNFRACTIONATED: 0.22 [IU]/mL — AB (ref 0.30–0.70)
Heparin Unfractionated: 0.52 IU/mL (ref 0.30–0.70)

## 2015-06-10 LAB — CBC
HCT: 28.6 % — ABNORMAL LOW (ref 36.0–46.0)
Hemoglobin: 9.5 g/dL — ABNORMAL LOW (ref 12.0–15.0)
MCH: 30 pg (ref 26.0–34.0)
MCHC: 33.2 g/dL (ref 30.0–36.0)
MCV: 90.2 fL (ref 78.0–100.0)
PLATELETS: 210 10*3/uL (ref 150–400)
RBC: 3.17 MIL/uL — ABNORMAL LOW (ref 3.87–5.11)
RDW: 13 % (ref 11.5–15.5)
WBC: 12.6 10*3/uL — ABNORMAL HIGH (ref 4.0–10.5)

## 2015-06-10 LAB — GLUCOSE, CAPILLARY
GLUCOSE-CAPILLARY: 126 mg/dL — AB (ref 65–99)
GLUCOSE-CAPILLARY: 137 mg/dL — AB (ref 65–99)
GLUCOSE-CAPILLARY: 144 mg/dL — AB (ref 65–99)
Glucose-Capillary: 121 mg/dL — ABNORMAL HIGH (ref 65–99)
Glucose-Capillary: 129 mg/dL — ABNORMAL HIGH (ref 65–99)
Glucose-Capillary: 97 mg/dL (ref 65–99)

## 2015-06-10 LAB — C DIFFICILE QUICK SCREEN W PCR REFLEX
C DIFFICILE (CDIFF) TOXIN: NEGATIVE
C DIFFICLE (CDIFF) ANTIGEN: POSITIVE — AB

## 2015-06-10 LAB — BASIC METABOLIC PANEL
Anion gap: 5 (ref 5–15)
BUN: 7 mg/dL (ref 6–20)
CALCIUM: 8.4 mg/dL — AB (ref 8.9–10.3)
CO2: 20 mmol/L — ABNORMAL LOW (ref 22–32)
Chloride: 110 mmol/L (ref 101–111)
Creatinine, Ser: 0.78 mg/dL (ref 0.44–1.00)
GFR calc Af Amer: >60 mL/min (ref >60)
GLUCOSE: 123 mg/dL — AB (ref 65–99)
Potassium: 4.7 mmol/L (ref 3.5–5.1)
Sodium: 135 mmol/L (ref 135–145)

## 2015-06-10 LAB — MAGNESIUM: Magnesium: 1.8 mg/dL (ref 1.7–2.4)

## 2015-06-10 LAB — PHOSPHORUS: PHOSPHORUS: 2.4 mg/dL — AB (ref 2.5–4.6)

## 2015-06-10 MED ORDER — DEXTROSE-NACL 5-0.45 % IV SOLN
INTRAVENOUS | Status: DC
  Administered 2015-06-10: 10:00:00 via INTRAVENOUS
  Administered 2015-06-10: 1000 mL via INTRAVENOUS
  Administered 2015-06-11 – 2015-06-12 (×2): via INTRAVENOUS
  Administered 2015-06-12: 1000 mL via INTRAVENOUS
  Administered 2015-06-13: 19:00:00 via INTRAVENOUS
  Administered 2015-06-14: 50 mL/h via INTRAVENOUS
  Administered 2015-06-14: 06:00:00 via INTRAVENOUS

## 2015-06-10 MED ORDER — FUROSEMIDE 10 MG/ML IJ SOLN
10.0000 mg | Freq: Once | INTRAMUSCULAR | Status: AC
  Administered 2015-06-10: 10 mg via INTRAVENOUS
  Filled 2015-06-10: qty 2

## 2015-06-10 MED ORDER — DIPHENOXYLATE-ATROPINE 2.5-0.025 MG/5ML PO LIQD
2.5000 mL | Freq: Three times a day (TID) | ORAL | Status: AC
  Administered 2015-06-10 (×3): 2.5 mL via ORAL
  Filled 2015-06-10 (×3): qty 5

## 2015-06-10 MED ORDER — MAGNESIUM SULFATE IN D5W 10-5 MG/ML-% IV SOLN
1.0000 g | Freq: Once | INTRAVENOUS | Status: AC
  Administered 2015-06-10: 1 g via INTRAVENOUS
  Filled 2015-06-10: qty 100

## 2015-06-10 NOTE — Progress Notes (Addendum)
eLink Physician-Brief Progress Note Patient Name: Dresden Ament DOB: [birth year] MRN: [id number]   Date of Service  06/10/2015  HPI/Events of Note  Called for agitation, desats to 80s  eICU Interventions  Increase FiO2 to 60%, PCXR now, fentanyl IV given for pain, agitation. Wrist restraints ordered.     Intervention Category Major Interventions: Hypoxemia - evaluation and management  Didier Brandenburg 06/10/2015, 5:31 PM

## 2015-06-10 NOTE — Progress Notes (Signed)
ANTICOAGULATION CONSULT NOTE - Follow-up Consult  Pharmacy Consult for heparin Indication: atrial fibrillation  No Known Allergies  Patient Measurements: Height: 5' 1.42" (156 cm) Weight: 150 lb 12.7 oz (68.4 kg) IBW/kg (Calculated) : 48.76 Heparin Dosing Weight: 63kg  Vital Signs: Temp: 99.8 F (37.7 C) (08/29 1210) Temp Source: Axillary (08/29 0418) BP: 168/119 mmHg (08/29 1145) Pulse Rate: 106 (08/29 1145)  Labs:  Recent Labs  06/08/15 0519  06/09/15 0431 06/09/15 1550 06/10/15 0438 06/10/15 1434  HGB 9.7*  --  9.0*  --  9.5*  --   HCT 28.8*  --  26.9*  --  28.6*  --   PLT 184  --  190  --  210  --   HEPARINUNFRC 0.54  < > 0.29* 0.47 0.22* 0.52  CREATININE 0.86  --  0.80  --  0.78  --   < > = values in this interval not displayed.  Estimated Creatinine Clearance: 45.9 mL/min (by C-G formula based on Cr of 0.78).  Assessment: 81 yo female with afib (CHADSVASC= 6; age, gender, HTN, DM, vascular disease) continues on heparin infusion. s/p thromboembolectomy of SMA on 06/07/15. The heparin level is now = 0.52, therapeutic on heparin IV rate 1050 units/hr.  No bleeding noted. This morning the CBC was stable with Hgb 9.5, up from 9.0 and PLTC up from 190K to 210K.   Goal of Therapy:  Heparin level 0.3-0.7 units/ml Monitor platelets by anticoagulation protocol: Yes   Plan:  Continue IV heparin drip at 1050 units/hr -F/u daily heparin level and CBC  Noah Delaine, RPh Clinical Pharmacist Pager: [phone]  06/10/2015 3:01 PM

## 2015-06-10 NOTE — Clinical Documentation Improvement (Signed)
Vascular Surgery  Can the diagnosis of Shock be further specified?   Shock, including Type:  Septic, Cardiogenic, Hyper/Hypoglycemic, Hypovolemic, Hemorrhagic, Neurogenic, Anaphylactic, Other type, including suspected or known cause and/or associated condition(s)  Other  Clinically Undetermined  Document any associated diagnoses/conditions.   Supporting Information: Hypotension, systemic shock noted per 8/25 progress notes. Shock, circulatory noted per 8/29 progress notes.   Please exercise your independent, professional judgment when responding. A specific answer is not anticipated or expected.   Thank You,  Rodman Pickle [facility] Information Management [place]

## 2015-06-10 NOTE — Progress Notes (Addendum)
PT Cancellation Note  Patient Details Name: Amber Hood MRN: [id number] DOB: 1927/12/08   Cancelled Treatment:    Reason Eval/Treat Not Completed: Medical issues which prohibited therapy. Attempted to see this am with pt on weaning mode with HR 122-142 at rest. Spoke with RT and felt pt may possibly extubate today and deferred evaluation. Will check again 8/30   Charna Neeb 06/10/2015, 3:36 PM  Pager [phone]

## 2015-06-10 NOTE — Progress Notes (Signed)
PULMONARY / CRITICAL CARE MEDICINE   Name: Amber Hood MRN: [id number] DOB: 1928-04-07    ADMISSION DATE:  05/24/2015 CONSULTATION DATE: 8/25  REFERRING MD :  Hart Rochester  CHIEF COMPLAINT:  Abd pain  INITIAL PRESENTATION: To [facility] with abd pain  HISTORY OF PRESENT ILLNESS:   81 yo AAM with history of dementia/falls, Afib but not on anticoagulation due to falls, who presented to [facility] 8/24 with acute abd pain. She was transferred to [facility] and to Care of Vascular surgery for exp lap with superior mesenteric thrombectomy. She returned from OR to SICU and PCCM asked to assist in post op management.  STUDIES:  TTE 8/28 - EF 65-70%. No regional wall motion abnormalities. Couldn't assess diastolic. Mild aortic stenosis. Mild mitral stenosis. RV normal in size and function.  SIGNIFICANT EVENTS: 8/25 - Admitted to [facility] 8/25 - S/P Ex Lap & thrombectomy for mesenteric thrombus  SUBJECTIVE: Remains intubated. No events overnight. Continuing to have intermittent rapid ventricular response. Urine output remained stable.  ROS: Unobtainable as the patient is currently intubated.  VITAL SIGNS: Temp:  [97.2 F (36.2 C)-98.8 F (37.1 C)] 97.2 F (36.2 C) (08/29 0418) Pulse Rate:  [25-133] 93 (08/29 0700) Resp:  [11-30] 23 (08/29 0700) BP: (100-164)/(39-96) 131/57 mmHg (08/29 0700) SpO2:  [92 %-100 %] 98 % (08/29 0700) Arterial Line BP: (143)/(72) 143/72 mmHg (08/28 0900) FiO2 (%):  [40 %] 40 % (08/29 0700) HEMODYNAMICS:   VENTILATOR SETTINGS: Vent Mode:  [-] PRVC FiO2 (%):  [40 %] 40 % Set Rate:  [12 bmp] 12 bmp Vt Set:  [400 mL] 400 mL PEEP:  [5 cmH20] 5 cmH20 Pressure Support:  [15 cmH20] 15 cmH20 Plateau Pressure:  [21 cmH20-24 cmH20] 21 cmH20 INTAKE / OUTPUT:  Intake/Output Summary (Last 24 hours) at 06/10/15 0843 Last data filed at 06/10/15 0600  Gross per 24 hour  Intake 2952.46 ml  Output   2000 ml  Net 952.46 ml    PHYSICAL EXAMINATION: General:  Elderly female. No  distress. Currently laying on her left side on ventilator. Neuro: Patient spontaneously moving all 4 extremities. Withdraws to pain all 4 extremities. Gives thumbs up intermittently on right hand. Shuts eyes forcefully preventing ocular exam. HEENT:  Endotracheal tube in place. Unable to perform ocular exam. Cardiovascular:  A. fib with RVR on telemetry. No edema. Unable to appreciate JVD. Lungs: Good aeration bilaterally on ventilator. Symmetric chest wall rise. Abdomen: Abdominal incision with staples intact, clean, & dry. Profoundly hypoactive bowel sounds. Nondistended. Soft. Musculoskeletal: No joint deformity or effusion appreciated. Skin: Warm and dry. No rash on exposed skin. Right internal jugular central venous catheter in place.  LABS:  CBC  Recent Labs Lab 06/08/15 0519 06/09/15 0431 06/10/15 0438  WBC 17.7* 14.3* 12.6*  HGB 9.7* 9.0* 9.5*  HCT 28.8* 26.9* 28.6*  PLT 184 190 210   Coag's  Recent Labs Lab 05/30/2015 0430  APTT 29  INR 1.37   BMET  Recent Labs Lab 06/08/15 0519 06/09/15 0431 06/10/15 0438  NA 133* 135 135  K 4.7 4.8 4.7  CL 110 112* 110  CO2 19* 20* 20*  BUN 11 8 7   CREATININE 0.86 0.80 0.78  GLUCOSE 133* 133* 123*   Electrolytes  Recent Labs Lab 06/08/15 0519 06/09/15 0431 06/10/15 0438  CALCIUM 7.9* 8.3* 8.4*  MG 2.0 2.1 1.8  PHOS 2.3* 1.9* 2.4*   Sepsis Markers  Recent Labs Lab 06/12/2015 1100 06/07/15 2238  LATICACIDVEN 3.0* 1.8   ABG  Recent Labs  Lab 06/09/2015 0950 05/20/2015 1359 06/08/15 0625  PHART 7.334* 7.336* 7.411  PCO2ART 33.3* 34.2* 28.5*  PO2ART 181.0* 118.0* 114*   Liver Enzymes  Recent Labs Lab 05/24/2015 0430  AST 40  ALT 12*  ALKPHOS 46  BILITOT 0.9  ALBUMIN 2.3*   Cardiac Enzymes  Recent Labs Lab 05/14/2015 1100  TROPONINI 0.07*   Glucose  Recent Labs Lab 06/09/15 0815 06/09/15 1219 06/09/15 1540 06/09/15 2009 06/10/15 0033 06/10/15 0416  GLUCAP 112* 128* 117* 118* 137* 121*     Imaging No results found.   ASSESSMENT / PLAN:  PULMONARY OETT 8/25>> A: VDRF S/P Ex Lap  P:   Vent bundle SBT daily Degree of hypoxia & mental status main barriers to extubation Likely will need diuresis prior to extubation  CARDIOVASCULAR CVL 8/25 rt i j >> A:  Hypotension pos-op - resolved A Fib w/ RVR - deemed not a candidate for anticoagulation due to falls but on heparin per surgery H/O HTN Mild AS & MS on TTE  P:  Cardiology following Digoxin IV daily Lopressor IV every 4 hours Avoiding amiodarone given potential for cardioversion Short term heparin ok - not sure can use this long term  RENAL A:  Hypokalemia - resolved. Hypophosphatemia - resolved  P:   Monitor electrolytes daily  Trend daily BUN/creatinine along with urine output Giving magnesium sulfate 1 g IV now   GASTROINTESTINAL A:  S/P Ex Lap POD #4 Superior Mesenteric Artery Thrombus  P:   Protonix IV daily NPO Tube feedings versus TPN per vascular surgery  HEMATOLOGIC A:   Leukocytosis - Likely reactive Anemia - no signs of active bleeding Systemic anticoagulation for atrial fibrillation  P:  Heparin drip for systemic and granulation per pharmacy protocol Monitoring daily hemoglobin with CBC Threshold for transfusion PRBC Hgb <8.0   INFECTIOUS A:   S/P Ex Lap  P:   Monitor for fever & plan for panculture for temp greater than 38.0 C Trending leukocytosis  Abx:  8/25 zoysn>>8/25 8/25 zinacef>>8/26  ENDOCRINE A:   H/O DM  P:   Sliding-scale insulin per algorithm Accu-Cheks every 6 hours  NEUROLOGIC A:   H/O Dementia Postoperative abdominal pain  P:   RASS goal: 0 Minimizing sedating medications Fentanyl IV when necessary   FAMILY  - Updates: No family at bedside to update.  - Inter-disciplinary family meet or Palliative Care meeting due by:  9/1   TODAY'S SUMMARY:   81 yo AAM with history of dementia/falls, Afib but not on anticoagulation due  to falls, who presented to [facility] 8/24 with acute abd pain. Poor candidate for extubation given still poor control of rapid ventricular response & 9 L positive fluid balance. Mental status continues to be a barrier as the patient is now only intermittently following commands.   I have spent a total of 37 minutes of critical care time today caring for this patient & reviewing the patient's electronic medical record.  Donna Christen Jamison Neighbor, M.D. [facility] Pulmonary & Critical Care Pager:  [phone] After 3pm or if no response, call [phone]  06/10/2015, 8:43 AM

## 2015-06-10 NOTE — Progress Notes (Signed)
OT Cancellation Note  Patient Details Name: Amber Hood MRN: [id number] DOB: Feb 14, 1928   Cancelled Treatment:    Reason Eval/Treat Not Completed: Other (comment) Pt may be extubated today and is currently weaning from vent. Will try to come back as time allows.   Earlie Raveling  OTR/L [phone]  06/10/2015, 9:28 AM

## 2015-06-10 NOTE — Care Management Note (Signed)
Case Management Note  Patient Details  Name: Amber Hood MRN: [id number] Date of Birth: 02/12/1928  Subjective/Objective:  Lives at home with daughter Kevan Rosebush, who does work, but other family members assist with patient and [facility] does also [facility].  SW Marylu Lund came by to see number, [phone], card on chart.  Patient remains on vent - discharge will depend on progression.  [facility] does have certain SNF that they are approved for.  Family lives in [place] and suspect if need SNF - [facility] will be closest to them.                    Action/Plan:   Expected Discharge Date:                  Expected Discharge Plan:  Home w Home Health Services  In-House Referral:  Clinical Social Work  Discharge planning Services  CM Consult  Post Acute Care Choice:    Choice offered to:     DME Arranged:    DME Agency:     HH Arranged:    HH Agency:     Status of Service:  In process, will continue to follow  Medicare Important Message Given:  Yes-second notification given Date Medicare IM Given:    Medicare IM give by:    Date Additional Medicare IM Given:    Additional Medicare Important Message give by:     If discussed at Long Length of Stay Meetings, dates discussed:    Additional Comments:  Vangie Bicker, RN 06/10/2015, 1:23 PM

## 2015-06-10 NOTE — Progress Notes (Signed)
RN called RT to patient room due to patient sats being 81%.  Placed patient on 100% FIO2 and suctioned, however still no improvement.  Bag lavaged patient and was able to suction moderate amount of thick, white, plugs.  Patient sats improved to 100% after.  MD ordered chest xray.  Will continue to monitor.

## 2015-06-10 NOTE — Progress Notes (Signed)
eLink Physician-Brief Progress Note Patient Name: Amber Hood DOB: [birth year] MRN: [id number]   Date of Service  06/10/2015  HPI/Events of Note  CXR reviewed. New RLL collapse. After frequent suctioning with moderate amount of thick, white, plugs O2 requirements are better now and stable.  eICU Interventions  Continue lavage and suctioning. ET tube to be retracted by 2 cm. Reassess RLL collapse in AM.      Intervention Category Major Interventions: Hypoxemia - evaluation and management  Emie Sommerfeld 06/10/2015, 10:32 PM

## 2015-06-10 NOTE — Progress Notes (Signed)
ANTICOAGULATION CONSULT NOTE - Follow-up Consult  Pharmacy Consult for heparin Indication: atrial fibrillation  No Known Allergies  Patient Measurements: Height: 5' 1.42" (156 cm) Weight: 150 lb 12.7 oz (68.4 kg) IBW/kg (Calculated) : 48.76 Heparin Dosing Weight: 63kg  Vital Signs: Temp: 97.2 F (36.2 C) (08/29 0418) Temp Source: Axillary (08/29 0418) BP: 121/61 mmHg (08/29 0307) Pulse Rate: 85 (08/29 0307)  Labs:  Recent Labs  06/08/15 0519  06/09/15 0431 06/09/15 1550 06/10/15 0438  HGB 9.7*  --  9.0*  --  9.5*  HCT 28.8*  --  26.9*  --  28.6*  PLT 184  --  190  --  210  HEPARINUNFRC 0.54  < > 0.29* 0.47 0.22*  CREATININE 0.86  --  0.80  --   --   < > = values in this interval not displayed.  Estimated Creatinine Clearance: 45.9 mL/min (by C-G formula based on Cr of 0.8).  Assessment: 81 yo female with afib (CHADSVASC= 6; age, gender, HTN, DM, vascular disease) continues on heparin at 900 units/hr. Heparin level below goal (0.22). CBC stable. No bleeding noted.  Goal of Therapy:  Heparin level 0.3-0.7 units/ml Monitor platelets by anticoagulation protocol: Yes   Plan:  -Increase heparin to 1050 units/hr -F/u 8 hr heparin level  Amber Hood, PharmD, BCPS Clinical pharmacist, pager [phone]  06/10/2015 5:10 AM

## 2015-06-10 NOTE — Progress Notes (Signed)
  [facility] Progress Note  Subjective  - POD # 4  On vent. Nods head when asked if she is in pain.   Objective Filed Vitals:   06/10/15 0700  BP: 131/57  Pulse: 93  Temp:   Resp: 23    Intake/Output Summary (Last 24 hours) at 06/10/15 0744 Last data filed at 06/10/15 0600  Gross per 24 hour  Intake 3101.46 ml  Output   2100 ml  Net 1001.46 ml    General: ill appearing elderly female on vent in NAD Lungs: respirations even, clear anterior CV: sinus tach Abdomen: soft, pushes away my hand during exam, midline staples clean and intact. Extremities: feet well perfused, brisk doppler flow bilaterally   Assessment/Planning: 81 y.o. female is s/p: exploratory laparotomy, thrombectomy of superior mesenteric artery 4 Days Post-Op   Pulm: VDRF, unable to extubate yesterday, per PCCM CV: BP stable off pressors. Sinus tach this am, on heparin gtt, not candidate for anticoagulation due to hx of falls.  Renal: Good UOP. Scr stable. GI: still with abdominal pain. Will discuss when to initiate tube feeds with Dr. Hart Rochester. ID: WBC improving Endocrine: SSI Neuro: Minimize sedating meds, hopefully able to extubate soon.   Appreciate PCCM and cardiology assistance.   Raymond Gurney 06/10/2015 7:44 AM --  Laboratory CBC    Component Value Date/Time   WBC 12.6* 06/10/2015 0438   HGB 9.5* 06/10/2015 0438   HCT 28.6* 06/10/2015 0438   PLT 210 06/10/2015 0438    BMET    Component Value Date/Time   NA 135 06/10/2015 0438   K 4.7 06/10/2015 0438   CL 110 06/10/2015 0438   CO2 20* 06/10/2015 0438   GLUCOSE 123* 06/10/2015 0438   BUN 7 06/10/2015 0438   CREATININE 0.78 06/10/2015 0438   CALCIUM 8.4* 06/10/2015 0438   GFRNONAA >60 06/10/2015 0438   GFRAA >60 06/10/2015 0438    COAG Lab Results  Component Value Date   INR 1.37 06/11/2015   No results found for: PTT  Antibiotics Anti-infectives    Start     Dose/Rate Route Frequency Ordered Stop     06/10/2015 1200  cefUROXime (ZINACEF) 1.5 g in dextrose 5 % 50 mL IVPB     1.5 g 100 mL/hr over 30 Minutes Intravenous Every 12 hours 05/15/2015 0424 06/07/15 0116   05/30/2015 0200  piperacillin-tazobactam (ZOSYN) IVPB 3.375 g     3.375 g 12.5 mL/hr over 240 Minutes Intravenous To Surgery 05/29/2015 0158 06/03/2015 0330   06/12/2015 0147  vancomycin (VANCOCIN) 1 GM/200ML IVPB    Comments:  Toney Sang   : cabinet override      05/13/2015 0147 05/25/2015 0225       Maris Berger, PA-C [facility] Office: [phone] Pager: [phone] 06/10/2015 7:44 AM  Agree with above assessment Patient's abdomen is soft and nontender 3+ femoral pulses and well-perfused feet Patient remains on ventilator with intermittent tachycardia-A. fib Off of pressors and now with blood pressure in the 140-150 systolic range WBCs continue to decrease to 12,000 Renal function is stable Chest x-ray continues to have relatively clear lung fields  She is now over 4 days post SMA thromboembolectomy Hopefully we can extubate this patient today and see how she gets along  Would continue to hold off on tube feeds until we get her extubated as do not want to risk aspiration following extubation  Patient making good progress other than her ventilatory support

## 2015-06-10 NOTE — Progress Notes (Signed)
RT pulled ETT back 2cm per MD.

## 2015-06-10 NOTE — Progress Notes (Signed)
Patient Name: Amber Hood Date of Encounter: 06/10/2015  Active Problems:   Acute mesenteric ischemia   Shock circulatory   Atrial fibrillation with RVR   Ventilator dependence   Respiratory failure   Respiratory failure requiring intubation    Primary Cardiologist: New - Dr. Mayford Knife Patient Profile: 81yo female w/ PMH of HTN, HLD, Atrial Fibrillation (not on chronic anticoagulation secondary to dementia and fall history), Dementia, and Type II DM who was transferred from [facility] on 06/01/2015 for management of acute mesenteric ischemia.  SUBJECTIVE: On Vent. Patient is arousable and able to nod/shake her head to questions.  OBJECTIVE Filed Vitals:   06/10/15 0604 06/10/15 0700 06/10/15 0757 06/10/15 0900  BP: 109/46 131/57 131/57   Pulse: 98 93 107   Temp:    99.1 F (37.3 C)  TempSrc:      Resp: 15 23 25    Height:      Weight:      SpO2: 97% 98% 98%     Intake/Output Summary (Last 24 hours) at 06/10/15 1024 Last data filed at 06/10/15 0600  Gross per 24 hour  Intake 2714.46 ml  Output   2000 ml  Net 714.46 ml   Filed Weights   05/13/2015 0800 06/08/15 0700  Weight: 147 lb 11.3 oz (67 kg) 150 lb 12.7 oz (68.4 kg)    PHYSICAL EXAM General: African American female in no acute distress. On Vent. Head: Normocephalic, atraumatic.  Neck: Supple without bruits, JVD not elevated. Lungs:  Resp regular and unlabored, CTA anteriorly without wheezing or rales. Heart: Irregularly irregular, no S3, S4, or murmur; no rub. Abdomen: Incision noted. Non-distended with normoactive bowel sounds.  No obvious abdominal masses. Extremities: No clubbing, cyanosis, or edema. Distal pedal pulses are 2+ bilaterally. Neuro: Alert and oriented X 3. Moves all extremities spontaneously. Psych: Normal affect.   LABS: CBC: Recent Labs  06/09/15 0431 06/10/15 0438  WBC 14.3* 12.6*  HGB 9.0* 9.5*  HCT 26.9* 28.6*  MCV 91.5 90.2  PLT 190 210   Basic Metabolic  Panel: Recent Labs  96/04/54 0431 06/10/15 0438  NA 135 135  K 4.8 4.7  CL 112* 110  CO2 20* 20*  GLUCOSE 133* 123*  BUN 8 7  CREATININE 0.80 0.78  CALCIUM 8.3* 8.4*  MG 2.1 1.8  PHOS 1.9* 2.4*   TELE: Atrial Fibrillation with rate in 90's - 120's.         ECHO: 06/09/15 Study Conclusions - Left ventricle: The cavity size was normal. Systolic function was vigorous. The estimated ejection fraction was in the range of 65% to 70%. Wall motion was normal; there were no regional wall motion abnormalities. - Aortic valve: Moderate diffuse calcification. There was mild stenosis. Valve area (VTI): 1.19 cm^2. Valve area (Vmax): 1.08 cm^2. Valve area (Vmean): 0.97 cm^2. - Mitral valve: Severely calcified annulus. The findings are consistent with mild stenosis. Valve area by continuity equation (using LVOT flow): 1.05 cm^2. - Tricuspid valve: There was trivial regurgitation.   Radiology/Studies: Dg Chest Port 1 View: 06/09/2015   CLINICAL DATA:  Respiratory failure and shortness of breath  EXAM: PORTABLE CHEST - 1 VIEW  COMPARISON:  1 day prior  FINDINGS: Patient rotated right. Endotracheal tube terminates 3.0 cm above carina. Nasogastric tube extends beyond the inferior aspect of the film. Right internal jugular line tip high SVC. Numerous leads and wires project over the chest. Cardiomegaly accentuated by AP portable technique. Small left pleural effusion, similar. No pneumothorax. Minimal  right infrahilar volume loss. Dense left lower lobe airspace disease is persistent.  IMPRESSION: Similar left base atelectasis or pneumonia with adjacent small left pleural effusion.  Cardiomegaly without congestive failure.   Electronically Signed   By: Jeronimo Greaves M.D.   On: 06/09/2015 08:53     Current Medications:  . antiseptic oral rinse  7 mL Mouth Rinse QID  . chlorhexidine gluconate  15 mL Mouth Rinse BID  . digoxin  0.25 mg Intravenous Daily  . docusate sodium  100 mg Oral  Daily  . Influenza vac split quadrivalent PF  0.5 mL Intramuscular Tomorrow-1000  . insulin aspart  0-15 Units Subcutaneous 6 times per day  . magnesium sulfate 1 - 4 g bolus IVPB  1 g Intravenous Once  . metoprolol  5 mg Intravenous Q4H  . pantoprazole (PROTONIX) IV  40 mg Intravenous Daily   . sodium chloride 10 mL/hr at 06/09/15 1800  . dextrose 5 % and 0.45% NaCl 100 mL/hr at 06/10/15 1015  . heparin 1,050 Units/hr (06/10/15 0515)  . propofol (DIPRIVAN) infusion Stopped (06/08/15 0800)    ASSESSMENT AND PLAN:  1. Chronic Atrial Fibrillation with RVR - Had not been on anticoagulation due to risk of falls and dementia, however she is now at increased risk of further cardioembolic events. Her CHA2DS2-VASc score is 7.   - Patient unable to currently communicate about her stability with walking and carrying out daily activities. Patient was previously residing in [facility] with her daughter. Will need to address risks/benefits of anticoagulation going forward. Continue with heparin for now. - LCSW with [facility] (patient's PCP) stopped by and said she would be willing to help facilitate conversation with the family at a later date about anticoagulation. Her number is [phone].  - Digoxin added 06/07/15. Continue at 0.25mg  daily. - HR has been in 90's -120's in the past 24 hours. Continue Metoprolol  IV Q4H.  2. Acute mesenteric ischemia - s/p exploratory laparotomy with thromboembolectomy of superior mesenteric artery on  05/27/2015.  3. HTN - BP has been 100/39 - 164/80 in the past 24 hours. - Had been on Neo-synephrine until 06/08/15.  Otherwise, per admitting team.   Signed, Wandra Mannan , PA-C 10:24 AM 06/10/2015   History and all data above reviewed.  Patient examined.  I agree with the findings as above.  The patient exam reveals [id number]  ,  Lungs: Decreased breath sounds  ,  Abd: Decreased bowel sounds, Ext  No edema  .  All available labs, radiology  testing, previous records reviewed. Agree with documented assessment and plan. Atrial fib:  Rate is OK and fluctuates but reasonable for the given situation.  For now no change in therapy.  Continue IV metroprolol.    Fayrene Fearing Sherissa Tenenbaum  2:51 PM  06/10/2015

## 2015-06-11 ENCOUNTER — Inpatient Hospital Stay (HOSPITAL_COMMUNITY): Payer: Medicare (Managed Care)

## 2015-06-11 LAB — RENAL FUNCTION PANEL
Albumin: 1.5 g/dL — ABNORMAL LOW (ref 3.5–5.0)
Anion gap: 4 — ABNORMAL LOW (ref 5–15)
BUN: 5 mg/dL — AB (ref 6–20)
CHLORIDE: 105 mmol/L (ref 101–111)
CO2: 22 mmol/L (ref 22–32)
CREATININE: 0.82 mg/dL (ref 0.44–1.00)
Calcium: 8 mg/dL — ABNORMAL LOW (ref 8.9–10.3)
GFR calc Af Amer: >60 mL/min (ref >60)
GFR calc non Af Amer: >60 mL/min (ref >60)
GLUCOSE: 124 mg/dL — AB (ref 65–99)
POTASSIUM: 4 mmol/L (ref 3.5–5.1)
Phosphorus: 2.5 mg/dL (ref 2.5–4.6)
Sodium: 131 mmol/L — ABNORMAL LOW (ref 135–145)

## 2015-06-11 LAB — HEMOGLOBIN AND HEMATOCRIT, BLOOD
HEMATOCRIT: 20.8 % — AB (ref 36.0–46.0)
HEMOGLOBIN: 7.1 g/dL — AB (ref 12.0–15.0)

## 2015-06-11 LAB — GLUCOSE, CAPILLARY
GLUCOSE-CAPILLARY: 131 mg/dL — AB (ref 65–99)
GLUCOSE-CAPILLARY: 128 mg/dL — AB (ref 65–99)
GLUCOSE-CAPILLARY: 90 mg/dL (ref 65–99)
Glucose-Capillary: 111 mg/dL — ABNORMAL HIGH (ref 65–99)
Glucose-Capillary: 135 mg/dL — ABNORMAL HIGH (ref 65–99)
Glucose-Capillary: 87 mg/dL (ref 65–99)
Glucose-Capillary: 99 mg/dL (ref 65–99)

## 2015-06-11 LAB — CBC WITH DIFFERENTIAL/PLATELET
BASOS ABS: 0 10*3/uL (ref 0.0–0.1)
Basophils Relative: 0 % (ref 0–1)
EOS ABS: 0 10*3/uL (ref 0.0–0.7)
Eosinophils Relative: 0 % (ref 0–5)
HEMATOCRIT: 23.6 % — AB (ref 36.0–46.0)
Hemoglobin: 7.9 g/dL — ABNORMAL LOW (ref 12.0–15.0)
LYMPHS ABS: 1.5 10*3/uL (ref 0.7–4.0)
Lymphocytes Relative: 11 % — ABNORMAL LOW (ref 12–46)
MCH: 30.2 pg (ref 26.0–34.0)
MCHC: 33.5 g/dL (ref 30.0–36.0)
MCV: 90.1 fL (ref 78.0–100.0)
Monocytes Absolute: 1.3 10*3/uL — ABNORMAL HIGH (ref 0.1–1.0)
Monocytes Relative: 10 % (ref 3–12)
NEUTROS ABS: 10.5 10*3/uL — AB (ref 1.7–7.7)
Neutrophils Relative %: 79 % — ABNORMAL HIGH (ref 43–77)
Platelets: 210 10*3/uL (ref 150–400)
RBC: 2.62 MIL/uL — ABNORMAL LOW (ref 3.87–5.11)
RDW: 12.9 % (ref 11.5–15.5)
WBC: 13.3 10*3/uL — ABNORMAL HIGH (ref 4.0–10.5)

## 2015-06-11 LAB — HEPARIN LEVEL (UNFRACTIONATED): HEPARIN UNFRACTIONATED: 0.47 [IU]/mL (ref 0.30–0.70)

## 2015-06-11 LAB — CLOSTRIDIUM DIFFICILE BY PCR: CDIFFPCR: POSITIVE — AB

## 2015-06-11 LAB — PROCALCITONIN: Procalcitonin: 0.4 ng/mL

## 2015-06-11 LAB — MAGNESIUM: MAGNESIUM: 1.7 mg/dL (ref 1.7–2.4)

## 2015-06-11 MED ORDER — FUROSEMIDE 10 MG/ML IJ SOLN
10.0000 mg | Freq: Once | INTRAMUSCULAR | Status: AC
Start: 2015-06-11 — End: 2015-06-11
  Administered 2015-06-11: 10 mg via INTRAVENOUS
  Filled 2015-06-11: qty 2

## 2015-06-11 MED ORDER — VANCOMYCIN 50 MG/ML ORAL SOLUTION
500.0000 mg | Freq: Four times a day (QID) | ORAL | Status: DC
  Administered 2015-06-11 – 2015-06-19 (×32): 500 mg via ORAL
  Filled 2015-06-11 (×35): qty 10

## 2015-06-11 MED ORDER — LORAZEPAM 2 MG/ML IJ SOLN
1.0000 mg | INTRAMUSCULAR | Status: DC | PRN
  Administered 2015-06-12 – 2015-06-15 (×7): 2 mg via INTRAVENOUS
  Filled 2015-06-11 (×7): qty 1

## 2015-06-11 MED ORDER — FENTANYL CITRATE (PF) 100 MCG/2ML IJ SOLN
25.0000 ug | INTRAMUSCULAR | Status: DC | PRN
  Administered 2015-06-11 – 2015-06-12 (×5): 50 ug via INTRAVENOUS
  Administered 2015-06-13 – 2015-06-14 (×4): 25 ug via INTRAVENOUS
  Administered 2015-06-14: 50 ug via INTRAVENOUS
  Administered 2015-06-14: 75 ug via INTRAVENOUS
  Administered 2015-06-14 (×3): 25 ug via INTRAVENOUS
  Administered 2015-06-14 – 2015-06-15 (×2): 50 ug via INTRAVENOUS
  Administered 2015-06-15 (×2): 75 ug via INTRAVENOUS
  Filled 2015-06-11 (×19): qty 2

## 2015-06-11 MED ORDER — LEVALBUTEROL HCL 0.63 MG/3ML IN NEBU
0.6300 mg | INHALATION_SOLUTION | Freq: Four times a day (QID) | RESPIRATORY_TRACT | Status: DC
  Administered 2015-06-11 – 2015-06-19 (×33): 0.63 mg via RESPIRATORY_TRACT
  Filled 2015-06-11 (×54): qty 3

## 2015-06-11 MED ORDER — METRONIDAZOLE IN NACL 5-0.79 MG/ML-% IV SOLN
500.0000 mg | Freq: Three times a day (TID) | INTRAVENOUS | Status: DC
  Administered 2015-06-11 – 2015-06-19 (×25): 500 mg via INTRAVENOUS
  Filled 2015-06-11 (×26): qty 100

## 2015-06-11 MED ORDER — GERHARDT'S BUTT CREAM
TOPICAL_CREAM | Freq: Two times a day (BID) | CUTANEOUS | Status: DC | PRN
  Administered 2015-06-11: 1 via TOPICAL
  Filled 2015-06-11: qty 1

## 2015-06-11 MED ORDER — MAGNESIUM SULFATE 2 GM/50ML IV SOLN
2.0000 g | Freq: Once | INTRAVENOUS | Status: AC
  Administered 2015-06-11: 2 g via INTRAVENOUS
  Filled 2015-06-11: qty 50

## 2015-06-11 MED ORDER — PHENYLEPHRINE HCL 10 MG/ML IJ SOLN
0.0000 ug/min | INTRAMUSCULAR | Status: DC
  Administered 2015-06-11 (×2): 5 ug/min via INTRAVENOUS
  Filled 2015-06-11: qty 1

## 2015-06-11 NOTE — Progress Notes (Signed)
Patient's stool testing was reflexed to PCR -> which was positive for toxigenic c.difficile. Since she is in the ICU, she was started on vancomycin  PO QID. Please reassess if she needs other interventions.  Duke Salvia Drue Second MD MPH [facility] [phone]

## 2015-06-11 NOTE — Progress Notes (Signed)
PT Discharge Note  Patient Details Name: Amber Hood MRN: [id number] DOB: 1928/09/25   Cancelled Treatment:    Reason Eval/Treat Not Completed: Medical issues which prohibited therapy. PT has been attempting to evaluate pt for many days. Noted overnight events and RN reports she becomes too agitated if sedation lifted. Will sign-off and please re-order PT when activity appropriate.   Gregroy Dombkowski 06/11/2015, 10:25 AM Pager [phone]

## 2015-06-11 NOTE — Progress Notes (Addendum)
ANTICOAGULATION CONSULT NOTE - Follow-up Consult  Pharmacy Consult for heparin Indication: atrial fibrillation  No Known Allergies  Patient Measurements: Height: 5' 1.42" (156 cm) Weight: 150 lb 12.7 oz (68.4 kg) IBW/kg (Calculated) : 48.76 Heparin Dosing Weight: 63kg  Vital Signs: Temp: 100.7 F (38.2 C) (08/30 0311) Temp Source: Oral (08/30 0311) BP: 109/68 mmHg (08/30 0900) Pulse Rate: 122 (08/30 0900)  Labs:  Recent Labs  06/09/15 0431  06/10/15 0438 06/10/15 1434 06/11/15 0406 06/11/15 0407  HGB 9.0*  --  9.5*  --  7.9*  --   HCT 26.9*  --  28.6*  --  23.6*  --   PLT 190  --  210  --  210  --   HEPARINUNFRC 0.29*  < > 0.22* 0.52  --  0.47  CREATININE 0.80  --  0.78  --  0.82  --   < > = values in this interval not displayed.  Estimated Creatinine Clearance: 44.8 mL/min (by C-G formula based on Cr of 0.82).  Assessment: 81 yo female with afib (CHADSVASC=7; age, gender, HTN, DM, vascular disease) continues on heparin infusion. She is s/p thromboembolectomy of SMA on 06/07/15. Today's heparin level is 0.47, remains therapeutic on heparin IV rate 1050 units/hr. Hgb decreased to 7.9, Pltc stable at 210K.  No signs of active bleeding noted.  MD checking repeat Hgb/Hct @ 1400 today.   Afib rate 90s-120s. Cardiologist notes that prior to admission the patient had not been on anticoagulation due to risk of falls and dementia, however she is now at increased risk of further cardioembolic events. Her CHA2DS2-VASc score is 7. Plan is to to address risks/benefits of anticoagulation going forward and to continue with heparin for now.  Goal of Therapy:  Heparin level 0.3-0.7 units/ml Monitor platelets by anticoagulation protocol: Yes   Plan:  Continue IV heparin drip at 1050 units/hr -F/u daily heparin level and CBC MD checking repeat Hgb/Hct @ 1400 today.  Noah Delaine, RPh Clinical Pharmacist Pager: [phone]  06/11/2015 9:57 AM

## 2015-06-11 NOTE — Progress Notes (Addendum)
[facility] Progress Note  Subjective  - POD # 5   Objective Filed Vitals:   06/11/15 0800  BP: 102/57  Pulse: 89  Temp:   Resp: 21    Intake/Output Summary (Last 24 hours) at 06/11/15 0830 Last data filed at 06/11/15 0806  Gross per 24 hour  Intake 3057.5 ml  Output   2530 ml  Net  527.5 ml    General: arousable on vent in NAD CV: irregularly irregular, tachy Lungs: clear anterior Abd: Incision clean and intact. Abdomen soft, no distension. Some grimacing to palpation.  Extremities: Left foot slightly cooler than right. No ischemic changes.   Assessment/Planning: 81 y.o. female is s/p: exploratory laparotomy, thrombectomy of superior mesenteric artery 4 Days Post-Op  5 Days Post-Op   Pulm: Still on vent. Patient with desaturation with RLL collapse secondary to mucus plugging. Mucus plugging resolved on CXR this am. Still vent dependent. Gently diuresing. Per PCCM.  CV: Afib rate 90s-120s. Cardiology following. Not candidate for anticoagulation due to hx of falls. On heparin gtt.  GI: Keep NPO. Will not initiate tube feeds until patient extubated. Continue NG tube.  Renal: Creatinine stable  ID: Had loose stools yesterday. No bloody stools. C diff antigen positive but toxin negative indicating colonization. Has only been on perioperative abx. Started on empiric flagyl. PCCM ordering procalcitonin. If normal, will d/c abx.  Endocrine: SSI  Neuro: Minimize sedating meds.   Appreciate PCCM and cardiology assistance.  Raymond Gurney 06/11/2015 8:30 AM --  Laboratory CBC    Component Value Date/Time   WBC 13.3* 06/11/2015 0406   HGB 7.9* 06/11/2015 0406   HCT 23.6* 06/11/2015 0406   PLT 210 06/11/2015 0406    BMET    Component Value Date/Time   NA 131* 06/11/2015 0406   K 4.0 06/11/2015 0406   CL 105 06/11/2015 0406   CO2 22 06/11/2015 0406   GLUCOSE 124* 06/11/2015 0406   BUN 5* 06/11/2015 0406   CREATININE 0.82 06/11/2015 0406   CALCIUM 8.0* 06/11/2015 0406   GFRNONAA >60 06/11/2015 0406   GFRAA >60 06/11/2015 0406    COAG Lab Results  Component Value Date   INR 1.37 05/14/2015   No results found for: PTT  Antibiotics Anti-infectives    Start     Dose/Rate Route Frequency Ordered Stop   06/11/15 0900  metroNIDAZOLE (FLAGYL) IVPB 500 mg     500 mg 100 mL/hr over 60 Minutes Intravenous Every 8 hours 06/11/15 0800     05/19/2015 1200  cefUROXime (ZINACEF) 1.5 g in dextrose 5 % 50 mL IVPB     1.5 g 100 mL/hr over 30 Minutes Intravenous Every 12 hours 05/26/2015 0424 06/07/15 0116   05/24/2015 0200  piperacillin-tazobactam (ZOSYN) IVPB 3.375 g     3.375 g 12.5 mL/hr over 240 Minutes Intravenous To Surgery 05/27/2015 0158 06/12/2015 0330   05/22/2015 0147  vancomycin (VANCOCIN) 1 GM/200ML IVPB    Comments:  Toney Sang   : cabinet override      06/11/2015 0147 05/13/2015 0225       Maris Berger, PA-C [facility] Office: [phone] Pager: [phone] 06/11/2015 8:30 AM  Discussed situation with Dr. Lewie Chamber in A. fib with rate better controlled in the 100 100s and 15 range Continues to be on ventilator with some agitation Abdomen is relatively soft with no rebound tenderness noted Midline incision well healed 3+ femoral pulses palpable bilaterally with well-perfused lower extremities  Patient now found to  have C. difficile and will be treated with by mouth vancomycin Hopefully she will continue to improve and be able to be extubated in the next few days  We'll discuss situation with her daughter today Unfortunately patient is quite frail as a baseline and although this was a fairly short operative procedure and she had no gangrenous bowel she is having trouble "bouncing back" from this   I discussed the current situation with her daughter Chuck Hint [phone] she seems to understand everything that is transpiring

## 2015-06-11 NOTE — Progress Notes (Signed)
OT Cancellation Note  Patient Details Name: Amber Hood MRN: [id number] DOB: April 23, 1928   Cancelled Treatment:    Reason Eval/Treat Not Completed: Noted overnight events and RN reports she becomes too agitated if sedation lifted. Will sign-off and please re-order OT when activity appropriate.   Earlie Raveling OTR/L [phone]  06/11/2015, 10:56 AM

## 2015-06-11 NOTE — Progress Notes (Signed)
PULMONARY / CRITICAL CARE MEDICINE   Name: Amber Hood MRN: [id number] DOB: 02/14/1928    ADMISSION DATE:  05/17/2015 CONSULTATION DATE: 8/25  REFERRING MD :  Hart Rochester  CHIEF COMPLAINT:  Abd pain  INITIAL PRESENTATION: To [facility] with abd pain  HISTORY OF PRESENT ILLNESS:   81 yo AAM with history of dementia/falls, Afib but not on anticoagulation due to falls, who presented to [facility] 8/24 with acute abd pain. She was transferred to [facility] and to [facility] surgery for exp lap with superior mesenteric thrombectomy. She returned from OR to SICU and PCCM asked to assist in post op management.  STUDIES:  TTE 8/28 - EF 65-70%. No regional wall motion abnormalities. Couldn't assess diastolic. Mild aortic stenosis. Mild mitral stenosis. RV normal in size and function.  SIGNIFICANT EVENTS: 8/25 - Admitted to [facility] 8/25 - S/P Ex Lap & thrombectomy for mesenteric thrombus  SUBJECTIVE: Patient had desaturation with collapse of right lower lobe for mucus plugging overnight. Copious thick plugs were able to be suctioned. The patient did have some pain 10 secretions. Of note yesterday the patient did have frequent loose bowel movements and had a rectal Foley placed. The patient expelled dust from force. She had no other acute events overnight. Nurses have been using 50 g of fentanyl as it does seem to drop her blood pressure significantly.  ROS: Unobtainable as the patient is currently intubated.  VITAL SIGNS: Temp:  [98.4 F (36.9 C)-100.7 F (38.2 C)] 100.7 F (38.2 C) (08/30 0311) Pulse Rate:  [86-128] 86 (08/30 0700) Resp:  [14-29] 17 (08/30 0700) BP: (78-186)/(29-151) 93/46 mmHg (08/30 0700) SpO2:  [79 %-100 %] 100 % (08/30 0700) FiO2 (%):  [40 %-100 %] 70 % (08/30 0600) HEMODYNAMICS:   VENTILATOR SETTINGS: Vent Mode:  [-] PRVC FiO2 (%):  [40 %-100 %] 70 % Set Rate:  [12 bmp] 12 bmp Vt Set:  [400 mL] 400 mL PEEP:  [5 cmH20] 5 cmH20 Pressure Support:  [8 cmH20] 8 cmH20 Plateau  Pressure:  [21 cmH20-23 cmH20] 21 cmH20 INTAKE / OUTPUT:  Intake/Output Summary (Last 24 hours) at 06/11/15 0801 Last data filed at 06/11/15 0700  Gross per 24 hour  Intake   2857 ml  Output   2450 ml  Net    407 ml    PHYSICAL EXAMINATION: General:  Elderly female. No distress. Currently laying on her back on ventilator. Neuro: Patient spontaneously moving all 4 extremities. Withdraws to pain all 4 extremities. Not giving thumbs up today. Shuts eyes forcefully preventing ocular exam. HEENT:  Endotracheal tube in place. Unable to perform ocular exam as pt forcefully closes eyes. Cardiovascular:  A. fib on tele. No edema. Unable to appreciate JVD. Lungs: Good aeration bilaterally on ventilator. Coarse breath sounds. Symmetric chest wall rise. Abdomen: Abdominal incision with staples. Hypoactive bowel sounds. Nondistended. Soft. Grimaces to palp. Musculoskeletal: No joint deformity or effusion appreciated. Skin: Warm and dry. No rash on exposed skin. Right internal jugular central venous catheter in place. Abdominal incision clean, dry, & in-tact.  LABS:  CBC  Recent Labs Lab 06/09/15 0431 06/10/15 0438 06/11/15 0406  WBC 14.3* 12.6* 13.3*  HGB 9.0* 9.5* 7.9*  HCT 26.9* 28.6* 23.6*  PLT 190 210 210   Coag's  Recent Labs Lab 05/20/2015 0430  APTT 29  INR 1.37   BMET  Recent Labs Lab 06/09/15 0431 06/10/15 0438 06/11/15 0406  NA 135 135 131*  K 4.8 4.7 4.0  CL 112* 110 105  CO2 20*  20* 22  BUN 8 7 5*  CREATININE 0.80 0.78 0.82  GLUCOSE 133* 123* 124*   Electrolytes  Recent Labs Lab 06/09/15 0431 06/10/15 0438 06/11/15 0406  CALCIUM 8.3* 8.4* 8.0*  MG 2.1 1.8 1.7  PHOS 1.9* 2.4* 2.5   Sepsis Markers  Recent Labs Lab 2015/06/26 1100 06/07/15 2238  LATICACIDVEN 3.0* 1.8   ABG  Recent Labs Lab 06-26-15 0950 2015-06-26 1359 06/08/15 0625  PHART 7.334* 7.336* 7.411  PCO2ART 33.3* 34.2* 28.5*  PO2ART 181.0* 118.0* 114*   Liver Enzymes  Recent  Labs Lab 06-26-15 0430 06/11/15 0406  AST 40  --   ALT 12*  --   ALKPHOS 46  --   BILITOT 0.9  --   ALBUMIN 2.3* 1.5*   Cardiac Enzymes  Recent Labs Lab 2015/06/26 1100  TROPONINI 0.07*   Glucose  Recent Labs Lab 06/10/15 0859 06/10/15 1234 06/10/15 1544 06/10/15 1956 06/10/15 2346 06/11/15 0346  GLUCAP 129* 144* 126* 97 87 131*    Imaging Dg Chest Port 1 View  06/10/2015   CLINICAL DATA:  Respiratory failure and shortness of breath  EXAM: PORTABLE CHEST - 1 VIEW  COMPARISON:  06/10/2015  FINDINGS: Endotracheal tube tip terminates approximately 1.8 cm above the carina. This could be pulled back 1-2 cm for more optimal positioning. Nasogastric tube tip terminates below the level of the diaphragms but is not included in the field of view. Interval right lower lobe collapse is identified. Small left pleural effusion noted tracking posteriorly. Both lung bases are obscured. Trace right pleural fluid.  IMPRESSION: Interval right lower lobe collapse.   Electronically Signed   By: Christiana Pellant M.D.   On: 06/10/2015 20:22   Dg Chest Port 1 View  06/10/2015   CLINICAL DATA:  Acute onset of respiratory failure. Patient unresponsive. Initial encounter.  EXAM: PORTABLE CHEST - 1 VIEW  COMPARISON:  Chest radiograph performed 06/09/2015  FINDINGS: The patient's endotracheal tube is seen ending 2 cm above the carina. An enteric tube is noted extending below the diaphragm. A right IJ line is seen ending about the mid SVC.  The lungs are hypoexpanded. New right mid lung and worsening left basilar airspace opacification raise concern for multifocal pneumonia. The appearance is less typical for pulmonary edema. A small left pleural effusion is suspected. No pneumothorax is seen.  The cardiomediastinal silhouette is mildly enlarged. No acute osseous abnormalities are identified.  IMPRESSION: 1. Endotracheal tube seen ending 2 cm above the carina. 2. Lungs hypoexpanded. New right mid lung and  worsening left basilar airspace opacification raise concern for multifocal pneumonia. The appearance is less typical for pulmonary edema. Small left pleural effusion suspected. 3. Mild cardiomegaly.   Electronically Signed   By: Roanna Raider M.D.   On: 06/10/2015 19:48   Dg Chest Port 1v Same Day  06/11/2015   CLINICAL DATA:  Hypoxia  EXAM: PORTABLE CHEST - 1 VIEW SAME DAY  COMPARISON:  06/10/2015  FINDINGS: Endotracheal tube in good position. Right jugular central venous catheter tip in the SVC. No pneumothorax. NG tube enters the stomach with the tip not visualized.  Cardiac enlargement. Progression of bilateral airspace disease. This may represent edema or pneumonia.  Hypoventilation. Bibasilar atelectasis and volume loss. Improvement in right lower lobe collapse. No change in left lower lobe atelectasis.  IMPRESSION: Progression of diffuse bilateral airspace disease, most likely due to edema. Infection not excluded  Improvement in right lower lobe collapse.   Electronically Signed   By: Leonette Most  Chestine Spore M.D.   On: 06/11/2015 07:57     ASSESSMENT / PLAN:  PULMONARY OETT 8/25>> A: VDRF S/P Ex Lap Mucus Plugging - resolved on repeat portable CXR  P:   Vent bundle SBT daily Degree of hypoxia & mental status main barriers to extubation Diuresing with Lasix IV Adding Xopenex neb q6hr for mucociliary clearance Chest PT via bed qid  CARDIOVASCULAR CVL 8/25 rt i j >> A:  Hypotension pos-op - resolved A Fib w/ RVR - deemed not a candidate for anticoagulation due to falls but on heparin per surgery H/O HTN Mild AS & MS on TTE  P:  Cardiology following Digoxin IV daily Lopressor IV every 4 hours Avoiding amiodarone given potential for cardioversion Short term heparin ok - not sure can use this long term Gentle Lasix diuresis w/ goal net negative 1L  RENAL A:  Hypomagnesemia - replacing IV Hypokalemia - resolved. Hypophosphatemia - resolved  P:   Monitor electrolytes daily   Trend daily BUN/creatinine along with urine output Giving magnesium sulfate 2 g IV now   GASTROINTESTINAL A:  S/P Ex Lap POD #4 Superior Mesenteric Artery Thrombus C diff Colonization vs Infection w/ Diarrhea  P:   Protonix IV daily NPO Tube feedings versus TPN per vascular surgery See ID Section  HEMATOLOGIC A:   Leukocytosis - Likely reactive Anemia - trending down. no signs of active bleeding Systemic anticoagulation for atrial fibrillation  P:  Repeat Hgb/Hct @ 1400 today Type & Screen @ 1400 today Heparin drip for systemic and granulation per pharmacy protocol Monitoring daily hemoglobin with CBC Threshold for transfusion PRBC Hgb <8.0  INFECTIOUS A:   S/P Ex Lap C diff Colonization vs Infection  P:   Monitor for fever & plan for panculture for temp greater than 38.0 C Trending leukocytosis Starting Flagyl for C diff Procalcitonin Algorithm  C diff toxin 8/29>>negative C diff antigen 8/29>>positive  Abx:  Flagyl 8/30>> 8/25 zoysn>>8/25 8/25 zinacef>>8/26  ENDOCRINE A:   H/O DM  P:   Sliding-scale insulin per algorithm Accu-Cheks every 6 hours  NEUROLOGIC A:   H/O Dementia Postoperative abdominal pain  P:   RASS goal: 0 to -1 Minimizing sedating medications Decreasing dose of Fentanyl IV prn   FAMILY  - Updates: No family at bedside to update.  - Inter-disciplinary family meet or Palliative Care meeting due by:  9/1   TODAY'S SUMMARY: 81 yo AAM with history of dementia/falls, Afib but not on anticoagulation due to falls, who presented to [facility] 8/24 with acute abd pain. Patient's mucous plugging has resolved. I am initiating some airway clearance maneuvers with Xopenex & chest PT. I am again trying gentle Lasix diuresis as the patient is now 10.5 L positive and appears to have pulmonary edema on her most recent chest x-ray. Her atrial fibrillation rate seems to be better controlled today on her current regimen. Given the diarrhea she  experienced yesterday and her persistent leukocytosis I am initiating empiric treatment of C. difficile and checking a serum Procalcitonin per the algorithm. If Procalcitonin is normal or equivocal I would favor discontinuing antibiotics at that time.  I have spent a total of 34 minutes of critical care time today caring for this patient & reviewing the patient's electronic medical record.  Donna Christen Jamison Neighbor, M.D. [facility] Pulmonary [facility] Critical Care Pager:  [phone] After 3pm or if no response, call [phone]  06/11/2015, 8:01 AM

## 2015-06-11 NOTE — Progress Notes (Signed)
Patient Name: Amber Hood Date of Encounter: 06/11/2015  Primary Cardiologist: New - Dr. Mayford Knife  Patient Profile: 81yo female w/ PMH of HTN, HLD, Atrial Fibrillation (not on chronic anticoagulation secondary to dementia and fall history), Dementia, and Type II DM who was transferred from [facility] on Jun 08, 2015 for management of acute mesenteric ischemia.   Active Problems:   Acute mesenteric ischemia   Shock circulatory   Atrial fibrillation with RVR   Ventilator dependence   Respiratory failure   Respiratory failure requiring intubation    SUBJECTIVE  Opens eye, intubated. Respond to stimuli.  CURRENT MEDS . antiseptic oral rinse  7 mL Mouth Rinse QID  . chlorhexidine gluconate  15 mL Mouth Rinse BID  . digoxin  0.25 mg Intravenous Daily  . docusate sodium  100 mg Oral Daily  . Influenza vac split quadrivalent PF  0.5 mL Intramuscular Tomorrow-1000  . insulin aspart  0-15 Units Subcutaneous 6 times per day  . levalbuterol  0.63 mg Nebulization Q6H  . metoprolol  5 mg Intravenous Q4H  . metronidazole  500 mg Intravenous Q8H  . pantoprazole (PROTONIX) IV  40 mg Intravenous Daily    OBJECTIVE  Filed Vitals:   06/11/15 1100 06/11/15 1131 06/11/15 1200 06/11/15 1230  BP: 114/62 114/62 114/55 114/43  Pulse:  110 95 81  Temp:    98.2 F (36.8 C)  TempSrc:      Resp: 17 20 22 18   Height:      Weight:      SpO2:  100% 100% 100%    Intake/Output Summary (Last 24 hours) at 06/11/15 1255 Last data filed at 06/11/15 1200  Gross per 24 hour  Intake 3399.5 ml  Output   2625 ml  Net  774.5 ml   Filed Weights   06-08-15 0800 06/08/15 0700  Weight: 147 lb 11.3 oz (67 kg) 150 lb 12.7 oz (68.4 kg)    PHYSICAL EXAM  General: opens eye, intubated, NG tube in place wall suction. Move arms after stimuli.  HEENT:  Normal  Neck: Supple without bruits or JVD. Lungs:  Intubated, anterior exam CTA Heart: irregular. Abdomen: Soft.  Extremities: SCD in place.    Accessory Clinical Findings  CBC  Recent Labs  06/10/15 0438 06/11/15 0406  WBC 12.6* 13.3*  NEUTROABS  --  10.5*  HGB 9.5* 7.9*  HCT 28.6* 23.6*  MCV 90.2 90.1  PLT 210 210   Basic Metabolic Panel  Recent Labs  06/10/15 0438 06/11/15 0406  NA 135 131*  K 4.7 4.0  CL 110 105  CO2 20* 22  GLUCOSE 123* 124*  BUN 7 5*  CREATININE 0.78 0.82  CALCIUM 8.4* 8.0*  MG 1.8 1.7  PHOS 2.4* 2.5   Liver Function Tests  Recent Labs  06/11/15 0406  ALBUMIN 1.5*    TELE A-fib with HR 80-120s, going through periods of tachycardia, then back to HR 80s    ECG  No new EKG  Echocardiogram 06/09/2015  - Left ventricle: The cavity size was normal. Systolic function was vigorous. The estimated ejection fraction was in the range of 65% to 70%. Wall motion was normal; there were no regional wall motion abnormalities. - Aortic valve: Moderate diffuse calcification. There was mild stenosis. Valve area (VTI): 1.19 cm^2. Valve area (Vmax): 1.08 cm^2. Valve area (Vmean): 0.97 cm^2. - Mitral valve: Severely calcified annulus. The findings are consistent with mild stenosis. Valve area by continuity equation (using LVOT flow): 1.05 cm^2. - Tricuspid valve:  There was trivial regurgitation.    Radiology/Studies  Dg Chest Port 1 View  06/10/2015   CLINICAL DATA:  Respiratory failure and shortness of breath  EXAM: PORTABLE CHEST - 1 VIEW  COMPARISON:  06/10/2015  FINDINGS: Endotracheal tube tip terminates approximately 1.8 cm above the carina. This could be pulled back 1-2 cm for more optimal positioning. Nasogastric tube tip terminates below the level of the diaphragms but is not included in the field of view. Interval right lower lobe collapse is identified. Small left pleural effusion noted tracking posteriorly. Both lung bases are obscured. Trace right pleural fluid.  IMPRESSION: Interval right lower lobe collapse.   Electronically Signed   By: Christiana Pellant M.D.    On: 06/10/2015 20:22   Dg Chest Port 1 View  06/10/2015   CLINICAL DATA:  Acute onset of respiratory failure. Patient unresponsive. Initial encounter.  EXAM: PORTABLE CHEST - 1 VIEW  COMPARISON:  Chest radiograph performed 06/09/2015  FINDINGS: The patient's endotracheal tube is seen ending 2 cm above the carina. An enteric tube is noted extending below the diaphragm. A right IJ line is seen ending about the mid SVC.  The lungs are hypoexpanded. New right mid lung and worsening left basilar airspace opacification raise concern for multifocal pneumonia. The appearance is less typical for pulmonary edema. A small left pleural effusion is suspected. No pneumothorax is seen.  The cardiomediastinal silhouette is mildly enlarged. No acute osseous abnormalities are identified.  IMPRESSION: 1. Endotracheal tube seen ending 2 cm above the carina. 2. Lungs hypoexpanded. New right mid lung and worsening left basilar airspace opacification raise concern for multifocal pneumonia. The appearance is less typical for pulmonary edema. Small left pleural effusion suspected. 3. Mild cardiomegaly.   Electronically Signed   By: Roanna Raider M.D.   On: 06/10/2015 19:48   Dg Chest Port 1 View  06/09/2015   CLINICAL DATA:  Respiratory failure and shortness of breath  EXAM: PORTABLE CHEST - 1 VIEW  COMPARISON:  1 day prior  FINDINGS: Patient rotated right. Endotracheal tube terminates 3.0 cm above carina. Nasogastric tube extends beyond the inferior aspect of the film. Right internal jugular line tip high SVC. Numerous leads and wires project over the chest. Cardiomegaly accentuated by AP portable technique. Small left pleural effusion, similar. No pneumothorax. Minimal right infrahilar volume loss. Dense left lower lobe airspace disease is persistent.  IMPRESSION: Similar left base atelectasis or pneumonia with adjacent small left pleural effusion.  Cardiomegaly without congestive failure.   Electronically Signed   By: Jeronimo Greaves  M.D.   On: 06/09/2015 08:53   Dg Chest Port 1 View  06/08/2015   CLINICAL DATA:  Respiratory failure requiring intubation J96.90 (ICD-10-CM)  EXAM: PORTABLE CHEST - 1 VIEW  COMPARISON:  06/07/2015  FINDINGS: Lung base opacity appears mildly improved the previous day's study. Residual opacity remains consistent with atelectasis likely with small effusions. No convincing pneumonia. No pulmonary edema.  No pneumothorax.  Cardiac silhouette is top-normal in size.  Endotracheal tube is stable, tip projecting 2.8 cm above the carina. Right internal jugular introducer sheath and orogastric tube are also stable and well positioned.  IMPRESSION: 1. Mild improvement in lung base atelectasis, likely associated with small effusions. 2. No evidence of pneumonia or edema.  No pneumothorax. 3. Support apparatus remains stable and well positioned.   Electronically Signed   By: Amie Portland M.D.   On: 06/08/2015 09:37   Dg Chest Port 1 View  06/07/2015   CLINICAL DATA:  Postoperative examination, history of new acute mesenteric ischemia, intubated patient.  EXAM: PORTABLE CHEST - 1 VIEW  COMPARISON:  Portable chest x-ray of June 06, 2015  FINDINGS: The lungs are borderline hypoinflated. There is subsegmental atelectasis in the right infrahilar region. The left lower lobe is more dense today and there is new obscuration of the left hemidiaphragm. The cardiac silhouette is mildly enlarged. The pulmonary vascularity is not engorged. The endotracheal tube tip lies 3.2 cm above the carina. The esophagogastric tube tip and proximal port lie below the GE junction. The right internal jugular Cordis sheath tip projects at the junction of the right and left brachiocephalic veins.  IMPRESSION: Interval development of bibasilar atelectasis. There is no pulmonary edema nor significant pleural effusion. The support tubes are in reasonable position.   Electronically Signed   By: David  [place] M.D.   On: 06/07/2015 07:29   Dg Chest Port  1 View  06/09/2015   CLINICAL DATA:  Intubation.  Prior surgery.  EXAM: PORTABLE CHEST - 1 VIEW  COMPARISON:  05/30/2015.  FINDINGS: Endotracheal tube tip 4 cm above the carina. Mediastinum and hilar structures are stable. Heart size stable. Low lung volumes with bilateral subsegmental atelectasis. No pleural effusion or pneumothorax. Degenerative changes thoracic spine. Surgical staples noted over the abdomen. Surgical clips right upper quadrant.  IMPRESSION: 1. Endotracheal tube tip 4 cm above the carina. 2. Low lung volumes with mild bilateral subsegmental atelectasis .   Electronically Signed   By: Maisie Fus  Register   On: 05/19/2015 07:04   Dg Chest Port 1 View  05/29/2015   CLINICAL DATA:  Preop.  Ischemic bowel.  EXAM: PORTABLE CHEST - 1 VIEW  COMPARISON:  None.  FINDINGS: Shallow inspiration. Borderline heart size and pulmonary vascularity, likely normal for technique. No focal consolidation in the lungs. Calcified and tortuous aorta. No blunting of costophrenic angles. No pneumothorax. Degenerative changes in the spine.  IMPRESSION: No active disease.   Electronically Signed   By: Burman Nieves M.D.   On: 05/19/2015 02:14   Dg Chest Port 1v Same Day  06/11/2015   CLINICAL DATA:  Hypoxia  EXAM: PORTABLE CHEST - 1 VIEW SAME DAY  COMPARISON:  06/10/2015  FINDINGS: Endotracheal tube in good position. Right jugular central venous catheter tip in the SVC. No pneumothorax. NG tube enters the stomach with the tip not visualized.  Cardiac enlargement. Progression of bilateral airspace disease. This may represent edema or pneumonia.  Hypoventilation. Bibasilar atelectasis and volume loss. Improvement in right lower lobe collapse. No change in left lower lobe atelectasis.  IMPRESSION: Progression of diffuse bilateral airspace disease, most likely due to edema. Infection not excluded  Improvement in right lower lobe collapse.   Electronically Signed   By: Marlan Palau M.D.   On: 06/11/2015 07:57   Dg Abd  Portable 1v  06/01/2015   CLINICAL DATA:  Laparoscopic cholecystectomy.  EXAM: PORTABLE ABDOMEN - 1 VIEW  COMPARISON:  CT 06/05/2015.  FINDINGS: NG tube noted with tip projected over the stomach. Surgical clips right upper quadrant. Surgical staples over the mid abdomen. No bowel distention or free air. Degenerative changes scoliosis lumbar spine. Pelvic phleboliths. Degenerative changes both hips.  IMPRESSION: 1.  NG tube noted with tip projected over the stomach.  2. Surgical staples over the abdomen. No bowel distention. No acute abnormality identified.   Electronically Signed   By: Maisie Fus  Register   On: 05/17/2015 07:07    ASSESSMENT AND PLAN  1. Chronic atrial fibrillation with  RVR  - Had not been on anticoagulation due to risk of falls and dementia, however she is now at increased risk of further cardioembolic events. Her CHA2DS2-VASc score is 7. Will hold on systemic anticoagulation for now given drop in hgb, will need to discuss with family once she is extubated and hgb stable  - Echo 06/09/2015 EF 65-70%, no RWMA, mild AS, mild MS.  - currently on digoxin and metoprolol 5mg  IV q4H. Continue current medication, given daily IV digoxin, will need to monitor for toxicity, however little alternative at this point.    - still intubated, on PRVC of ventilator setting. Respond to stimuli, opens eye  2. Acute mesenteric ischemia  - s/p exploratory laparotomy with thromboembolectomy of superior mesenteric artery on 27-Jun-2015.  3. HTN: some hypotension this morning, now SBP in 100-110s, still on neo-synephrine  4. Anemia: hgb dropped to 7.9 this morning, 9.5 yesterday   5. RLL collapse: improved on followup CXR  6. Positive C-diff 8/29  SignedAzalee Course PA-C Pager: [phone]   History and all data above reviewed.  Patient examined.  I agree with the findings as above.  Atrial fib with RVR earlier this am while hypotensive.  The patient exam reveals COR:  Irregular  ,  Lungs: Clear  ,  Abd:  Positive bowel sounds, no rebound no guarding, Ext No edema  .  All available labs, radiology testing, previous records reviewed. Agree with documented assessment and plan. Atrial fib with RVR:  Rapid rate was secondary low blood pressure and not the cause of the low BP.  Now her pressure is supported with low dose phenylephrine.  HR is improved.  Continue supportive care.  I will likely reduce the dose of digoxin in the AM.     Rollene Rotunda  3:31 PM  06/11/2015

## 2015-06-12 DIAGNOSIS — S301XXA Contusion of abdominal wall, initial encounter: Secondary | ICD-10-CM

## 2015-06-12 LAB — GLUCOSE, CAPILLARY
GLUCOSE-CAPILLARY: 119 mg/dL — AB (ref 65–99)
Glucose-Capillary: 127 mg/dL — ABNORMAL HIGH (ref 65–99)
Glucose-Capillary: 120 mg/dL — ABNORMAL HIGH (ref 65–99)
Glucose-Capillary: 129 mg/dL — ABNORMAL HIGH (ref 65–99)

## 2015-06-12 LAB — CBC WITH DIFFERENTIAL/PLATELET
BASOS ABS: 0 10*3/uL (ref 0.0–0.1)
BASOS PCT: 0 % (ref 0–1)
EOS ABS: 0.1 10*3/uL (ref 0.0–0.7)
Eosinophils Relative: 1 % (ref 0–5)
HCT: 20.5 % — ABNORMAL LOW (ref 36.0–46.0)
Hemoglobin: 6.8 g/dL — CL (ref 12.0–15.0)
Lymphocytes Relative: 15 % (ref 12–46)
Lymphs Abs: 2 10*3/uL (ref 0.7–4.0)
MCH: 29.7 pg (ref 26.0–34.0)
MCHC: 33.2 g/dL (ref 30.0–36.0)
MCV: 89.5 fL (ref 78.0–100.0)
MONO ABS: 1.2 10*3/uL — AB (ref 0.1–1.0)
Monocytes Relative: 9 % (ref 3–12)
NEUTROS PCT: 75 % (ref 43–77)
Neutro Abs: 10.3 10*3/uL — ABNORMAL HIGH (ref 1.7–7.7)
PLATELETS: 205 10*3/uL (ref 150–400)
RBC: 2.29 MIL/uL — ABNORMAL LOW (ref 3.87–5.11)
RDW: 13 % (ref 11.5–15.5)
WBC: 13.6 10*3/uL — AB (ref 4.0–10.5)

## 2015-06-12 LAB — RENAL FUNCTION PANEL
ALBUMIN: 1.4 g/dL — AB (ref 3.5–5.0)
Anion gap: 5 (ref 5–15)
CHLORIDE: 106 mmol/L (ref 101–111)
CO2: 22 mmol/L (ref 22–32)
Calcium: 8.1 mg/dL — ABNORMAL LOW (ref 8.9–10.3)
Creatinine, Ser: 0.79 mg/dL (ref 0.44–1.00)
GFR calc Af Amer: >60 mL/min (ref >60)
GFR calc non Af Amer: >60 mL/min (ref >60)
GLUCOSE: 184 mg/dL — AB (ref 65–99)
POTASSIUM: 3.8 mmol/L (ref 3.5–5.1)
Phosphorus: 2.7 mg/dL (ref 2.5–4.6)
Sodium: 133 mmol/L — ABNORMAL LOW (ref 135–145)

## 2015-06-12 LAB — CBC
HEMATOCRIT: 20.4 % — AB (ref 36.0–46.0)
HEMOGLOBIN: 6.9 g/dL — AB (ref 12.0–15.0)
MCH: 30.1 pg (ref 26.0–34.0)
MCHC: 33.8 g/dL (ref 30.0–36.0)
MCV: 89.1 fL (ref 78.0–100.0)
Platelets: 191 10*3/uL (ref 150–400)
RBC: 2.29 MIL/uL — ABNORMAL LOW (ref 3.87–5.11)
RDW: 13 % (ref 11.5–15.5)
WBC: 13.1 10*3/uL — ABNORMAL HIGH (ref 4.0–10.5)

## 2015-06-12 LAB — PROCALCITONIN: Procalcitonin: 0.52 ng/mL

## 2015-06-12 LAB — HEPARIN LEVEL (UNFRACTIONATED): Heparin Unfractionated: 0.39 IU/mL (ref 0.30–0.70)

## 2015-06-12 LAB — MAGNESIUM: MAGNESIUM: 1.9 mg/dL (ref 1.7–2.4)

## 2015-06-12 LAB — PREPARE RBC (CROSSMATCH)

## 2015-06-12 LAB — HEMOGLOBIN AND HEMATOCRIT, BLOOD
HEMATOCRIT: 23.8 % — AB (ref 36.0–46.0)
HEMOGLOBIN: 8.1 g/dL — AB (ref 12.0–15.0)

## 2015-06-12 MED ORDER — VITAL HIGH PROTEIN PO LIQD
1000.0000 mL | ORAL | Status: DC
  Administered 2015-06-12 – 2015-06-18 (×5): 1000 mL
  Filled 2015-06-12 (×11): qty 1000

## 2015-06-12 MED ORDER — FUROSEMIDE 10 MG/ML IJ SOLN
40.0000 mg | Freq: Once | INTRAMUSCULAR | Status: AC
  Administered 2015-06-12: 40 mg via INTRAVENOUS
  Filled 2015-06-12: qty 4

## 2015-06-12 MED ORDER — SODIUM CHLORIDE 0.9 % IV SOLN: Freq: Once | INTRAVENOUS | Status: DC

## 2015-06-12 MED ORDER — DIGOXIN 0.25 MG/ML IJ SOLN
0.1250 mg | Freq: Every day | INTRAMUSCULAR | Status: DC
  Administered 2015-06-13 – 2015-06-14 (×2): 0.125 mg via INTRAVENOUS
  Filled 2015-06-12 (×2): qty 0.5

## 2015-06-12 MED ORDER — FUROSEMIDE 10 MG/ML IJ SOLN: 40.0000 mg | Freq: Once | INTRAMUSCULAR | Status: DC

## 2015-06-12 NOTE — Progress Notes (Signed)
Patient Name: Amber Hood Date of Encounter: 06/12/2015  Active Problems:   Acute mesenteric ischemia   Shock circulatory   Atrial fibrillation with RVR   Ventilator dependence   Respiratory failure   Respiratory failure requiring intubation  SUBJECTIVE  Sedated and intubated. Now off heparin due to abdominal wall hematoma. Transfused PRBCs. On Neo-Synephrine.  CURRENT MEDS . sodium chloride   Intravenous Once  . antiseptic oral rinse  7 mL Mouth Rinse QID  . chlorhexidine gluconate  15 mL Mouth Rinse BID  . digoxin  0.25 mg Intravenous Daily  . docusate sodium  100 mg Oral Daily  . Influenza vac split quadrivalent PF  0.5 mL Intramuscular Tomorrow-1000  . insulin aspart  0-15 Units Subcutaneous 6 times per day  . levalbuterol  0.63 mg Nebulization Q6H  . metoprolol  5 mg Intravenous Q4H  . metronidazole  500 mg Intravenous Q8H  . pantoprazole (PROTONIX) IV  40 mg Intravenous Daily  . vancomycin  500 mg Oral Q6H    OBJECTIVE  Filed Vitals:   06/12/15 1000 06/12/15 1004 06/12/15 1100 06/12/15 1108  BP: 102/52 104/53 106/68 106/68  Pulse: 94 100 92 66  Temp:  97 F (36.1 C)    TempSrc:  Axillary    Resp: Height:      Weight:      SpO2: 100% 100% 100% 100%    Intake/Output Summary (Last 24 hours) at 06/12/15 1141 Last data filed at 06/12/15 1100  Gross per 24 hour  Intake 3366.38 ml  Output   4205 ml  Net -838.62 ml   Filed Weights   Jun 20, 2015 0800 06/08/15 0700  Weight: 147 lb 11.3 oz (67 kg) 150 lb 12.7 oz (68.4 kg)    PHYSICAL EXAM  General: Intubated.  Neuro: Moves all extremities spontaneously.  Psych: Intubated, unable to assess.  HEENT:  Normal  Neck: Supple without bruits or JVD. Right IJ CVC in place Lungs:  Resp regular and unlabored. Course breath sound.  Heart: Ir Ir, no murmur Abdomen: Hypoactive bowel sounds. Hematoma palpated to the left of abdominal incision. Extremities: No clubbing, cyanosis or edema. DP/PT/Radials 2+  and equal bilaterally.  Accessory Clinical Findings  CBC  Recent Labs  06/11/15 0406  06/12/15 0423 06/12/15 0540  WBC 13.3*  --  13.6* 13.1*  NEUTROABS 10.5*  --  10.3*  --   HGB 7.9*  < > 6.8* 6.9*  HCT 23.6*  < > 20.5* 20.4*  MCV 90.1  --  89.5 89.1  PLT 210  --  205 191  < > = values in this interval not displayed. Basic Metabolic Panel  Recent Labs  06/11/15 0406 06/12/15 0423  NA 131* 133*  K 4.0 3.8  CL 105 106  CO2 22 22  GLUCOSE 124* 184*  BUN 5* <5*  CREATININE 0.82 0.79  CALCIUM 8.0* 8.1*  MG 1.7 1.9  PHOS 2.5 2.7   Liver Function Tests  Recent Labs  06/11/15 0406 06/12/15 0423  ALBUMIN 1.5* 1.4*    TELE  afib    ASSESSMENT AND PLAN   1. Chronic atrial fibrillation with RVR - Had not been on anticoagulation due to risk of falls and dementia, however she is now at increased risk of further cardioembolic events. Her CHA2DS2-VASc score is 7. Will hold on systemic anticoagulation. Abdominal hematoma on heparin, now discontinued. Transfused PRBCs,  - Echo 06/09/2015 EF 65-70%, no RWMA, mild AS, mild MS. - rate improved and BP  stable on phenylephrine. - Management per MD. Likely reduce digoxin dose.   2. Acute mesenteric ischemia - s/p exploratory laparotomy with thromboembolectomy of superior mesenteric artery on 05/13/2015. - now with abdominal   3. HTN: Improved on neo-synephrine  4. Anemia: Hgb of 6.9. Transfused PRBCs.  Signed, Bhagat,Bhavinkumar PA-C   History and all data above reviewed.  Patient examined.  I agree with the findings as above.  She is intubated and sedated. The patient exam reveals COR:RRR  ,  Lungs: Clear  ,  Abd: Absent bowel sounds , Ext No edema  .  All available labs, radiology testing, previous records reviewed. Agree with documented assessment and plan. Atrial fib:  Rate OK for the situation.  I will reduce her dig.  We will see as needed.   Fayrene Fearing Tristina Sahagian  1:02 PM  06/12/2015

## 2015-06-12 NOTE — Progress Notes (Signed)
Pt noted to have a hematoma 4x 8 cm to the left of her abdominal incision.. Hgb decreased from 7.1 to 6.8 with repeat 6.9 .Dr Isaiah Serge notified.  Heparin dc'd.  .  Dr Myra Gianotti  notiified of same.  Pt to receive one unit PRBC's.      131/53   94 Afib  14 100% on Vent 40%

## 2015-06-12 NOTE — Progress Notes (Signed)
Spoke to Dr. Hart Rochester and he will have a family meeting with family at 10am tomorrow morning.  Relayed info to RN and she will facilitate with case management.   I have put in nutrition consult for tube feeds.   Doreatha Massed 06/12/2015 2:16 PM

## 2015-06-12 NOTE — Care Management Important Message (Signed)
Important Message  Patient Details  Name: Etana Beets MRN: [id number] Date of Birth: [birth year]   Medicare Important Message Given:  Yes-third notification given    Kyla Balzarine 06/12/2015, 2:21 PMImportant Message  Patient Details  Name: Carliss Porcaro MRN: [id number] Date of Birth: March 20, 1928   Medicare Important Message Given:  Yes-third notification given    Kyla Balzarine 06/12/2015, 2:21 PM

## 2015-06-12 NOTE — Progress Notes (Signed)
Nutrition Consult/Follow Up  DOCUMENTATION CODES:   Not applicable  INTERVENTION:    Initiate Vital High Protein formula at 25 ml/hr and increase by 10 ml every 4 hours to goal rate of 55 ml/hr  TF regimen to provide 1320 kcals, 115 gm protein, 1103 ml of free water  NUTRITION DIAGNOSIS:   Inadequate oral intake related to inability to eat as evidenced by NPO status, ongoing  GOAL:   Patient will meet greater than or equal to 90% of their needs, currently unmet  MONITOR:   TF tolerance, Vent status, Labs, Weight trends, I & O's  ASSESSMENT:   81 y.o. Female who presents for evaluation of abdominal pain. Patient was evaluated at [facility] for acute onset of abdominal pain at approximately 4 PM. 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 Patient has a long history of intermittent atrial fibrillation. She is not on chronic anticoagulation. She was transferred here for attempted thrombectomy of superior mesenteric artery. She has had no previous emboli. She has had previous abdominal procedures.   Patient s/p procedures 8/25: EXPLORATORY LAPAROTOMY THROMBOEMBOLECTOMY OF SUPERIOR MESENTERIC ARTERY  Patient is currently intubated on ventilator support -- NGT in place MV: 5.7 L/min Temp (24hrs), Avg:97.7 F (36.5 C), Min:96.9 F (36.1 C), Max:98.9 F (37.2 C)   RD consulted for TF initiation & management per Vascular Surgery.  Diet Order:  Diet NPO time specified  Skin:  Reviewed, no issues  Last BM:  8/31  Height:   Ht Readings from Last 1 Encounters:  05/25/2015 5' 1.42" (1.56 m)    Weight:   Wt Readings from Last 1 Encounters:  06/08/15 150 lb 12.7 oz (68.4 kg)    Ideal Body Weight:  47.7 kg  BMI:  Body mass index is 28.11 kg/(m^2).  Re-estimated Nutritional Needs:   Kcal:  1209  Protein:  110-120 gm  Fluid:  per MD  EDUCATION NEEDS:   No education needs identified at this time  Maureen Chatters, RD,  LDN Pager #: [phone] After-Hours Pager #: [phone]

## 2015-06-12 NOTE — Progress Notes (Addendum)
  Progress Note    06/12/2015 8:37 AM 6 Days Post-Op  Subjective:  Intubated/sedated.  Afebrile HR 70's-110's Afib 90's-120's systolic 100% .[id number]   Gtts: Heparin off this am due to abdominal wall hematoma Phenylephrine 26mcg/min  ABx/antifungal: Flagyl Vancomycin  Filed Vitals:   06/12/15 0833  BP:   Pulse:   Temp: 97 F (36.1 C)  Resp:     Physical Exam: Cardiac:  irregular Lungs:  Coarse BS throughout Incisions:  C/d/i with staples in tact Extremities:  Monophasic DP doppler signals bilaterally Abdomen:  Soft; new hematoma left lateral of incision; -BS  CBC    Component Value Date/Time   WBC 13.1* 06/12/2015 0540   RBC 2.29* 06/12/2015 0540   HGB 6.9* 06/12/2015 0540   HCT 20.4* 06/12/2015 0540   PLT 191 06/12/2015 0540   MCV 89.1 06/12/2015 0540   MCH 30.1 06/12/2015 0540   MCHC 33.8 06/12/2015 0540   RDW 13.0 06/12/2015 0540   LYMPHSABS 2.0 06/12/2015 0423   MONOABS 1.2* 06/12/2015 0423   EOSABS 0.1 06/12/2015 0423   BASOSABS 0.0 06/12/2015 0423    BMET    Component Value Date/Time   NA 133* 06/12/2015 0423   K 3.8 06/12/2015 0423   CL 106 06/12/2015 0423   CO2 22 06/12/2015 0423   GLUCOSE 184* 06/12/2015 0423   BUN <5* 06/12/2015 0423   CREATININE 0.79 06/12/2015 0423   CALCIUM 8.1* 06/12/2015 0423   GFRNONAA >60 06/12/2015 0423   GFRAA >60 06/12/2015 0423    INR    Component Value Date/Time   INR 1.37 2015/06/25 0430     Intake/Output Summary (Last 24 hours) at 06/12/15 0837 Last data filed at 06/12/15 0700  Gross per 24 hour  Intake 3157.88 ml  Output   2705 ml  Net 452.88 ml   NGT output:  550cc/24hrs   Assessment:  81 y.o. female is s/p:  Exploratory Laparotomy; Thromboembolectomy of Superior Mesenteric Artery  6 Days Post-Op  Plan: -new abdominal wall hematoma left lateral of incision-d/c heparin and transfuse one unit of PRBC's -acute blood loss anemia-PRBC's ordered -DVT prophylaxis:  Heparin d/c'd due to  abdominal wall hematoma-continue to monitor -vent management per CCM -C diff-flagyl / po vanc  -still with leukocytosis but slightly down today.   -good UOP with lasix   Doreatha Massed, PA-C [facility] [phone] 06/12/2015 8:37 AM  Agree with transfusion. Would give 2 units of packed red blood cells. Remains on ventilator with blood pressure 110 to 1:30 this a.m.-heart rate 110 A. fib On Flagyl and by mouth vancomycin for C. difficile Abdominal exam does reveal small-to-moderate hematoma along the left side of midline incision and the abdominal wall. Otherwise abdomen is relatively soft.  Continue to work toward further diuresis and hopefully extubation We'll continue to hold heparin for now

## 2015-06-12 NOTE — Progress Notes (Signed)
CRITICAL VALUE ALERT  Critical value received:  HGB 6.8  Date of notification: 06/12/2015  Time of notification: 0445  Critical value read back: yes  Nurse who received        Kevin Fenton, RN  MD notified (1st page):   Dr Isaiah Serge Time of first page:   0550  MD notified (2nd page):  Time of second page:  Responding MD: Dr Isaiah Serge  Time MD responded: [phone]

## 2015-06-12 NOTE — Progress Notes (Signed)
eLink Physician-Brief Progress Note Patient Name: Amber Hood DOB: 02-Jan-1928 MRN: [id number]   Date of Service  06/12/2015  HPI/Events of Note  Repeat CBC still shows low Hb at 6.9. Nurse reports new abdominal wall hematoma left of ex lap incision site.    eICU Interventions  Transfuse 1 unit PRBC. Hold heparin drip.  Inform vascular surgery about the hematoma for further evaluation.      Intervention Category Major Interventions: Hemorrhage - evaluation and management  Naftuli Dalsanto 06/12/2015, 6:48 AM

## 2015-06-12 NOTE — Progress Notes (Signed)
Consent obtained from daughter Jamesetta So and witnessed via telephone with second RN. Consent signed and in chart.

## 2015-06-12 NOTE — Progress Notes (Signed)
eLink Physician-Brief Progress Note Patient Name: Amber Hood DOB: 1928/03/23 MRN: [id number]   Date of Service  06/12/2015  HPI/Events of Note  Hb 6.8 today AM. No evidence of bleed. As per RN all H/H values are low in unit today AM. I/O still positive  eICU Interventions  Repeat H/H. Transfuse 1 unit PRBC if still low. Lasix 40 mg IV once   Intervention Category Intermediate Interventions: Other:  [name] 06/12/2015, 4:59 AM

## 2015-06-12 NOTE — Care Management Note (Signed)
Case Management Note  Patient Details  Name: Caoimhe Damron MRN: [id number] Date of Birth: 1928/03/24  Subjective/Objective:  Son, Bethann Berkshire in to see mother.  Verified that Jamesetta So, daughter is POA - Arita Miss has that paperwork and will send over to [place].  Lives with daughter Milda Smart.  There are 8 children.  Discussed need to talk with family as a group.  Asked son about what Mom's desire is.  Said didn't know, would talk with rest of family and get back with [place] on time some, including Jamesetta So could meet.  Palliative Care Consult would probably be helpful here with family and sorting out goals.                  Action/Plan:   Expected Discharge Date:                  Expected Discharge Plan:  Skilled Nursing Facility  In-House Referral:  Clinical Social Work  Discharge planning Services  CM Consult  Post Acute Care Choice:    Choice offered to:     DME Arranged:    DME Agency:     HH Arranged:    HH Agency:     Status of Service:  In process, will continue to follow  Medicare Important Message Given:  Yes-second notification given Date Medicare IM Given:    Medicare IM give by:    Date Additional Medicare IM Given:    Additional Medicare Important Message give by:     If discussed at Long Length of Stay Meetings, dates discussed:    Additional Comments:  Vangie Bicker, RN 06/12/2015, 11:27 AM

## 2015-06-12 NOTE — Progress Notes (Signed)
PULMONARY / CRITICAL CARE MEDICINE   Name: Amber Hood MRN: [id number] DOB: 07-31-1928    ADMISSION DATE:  2015-06-12 CONSULTATION DATE: 8/25  REFERRING MD :  Hart Rochester  CHIEF COMPLAINT:  Abd pain  INITIAL PRESENTATION: To [facility] with abd pain  HISTORY OF PRESENT ILLNESS:   81 yo AAM with history of dementia/falls, Afib but not on anticoagulation due to falls, who presented to [facility] 8/24 with acute abd pain. She was transferred to [facility] and to Care of Vascular surgery for exp lap with superior mesenteric thrombectomy. She returned from OR to SICU and PCCM asked to assist in post op management.  STUDIES:  TTE 8/28 - EF 65-70%. No regional wall motion abnormalities. Couldn't assess diastolic. Mild aortic stenosis. Mild mitral stenosis. RV normal in size and function.  SIGNIFICANT EVENTS: 8/25 - Admitted to [facility] 8/25 - S/P Ex Lap & thrombectomy for mesenteric thrombus  SUBJECTIVE: Patient developed an abdominal wall hematoma while on heparin drip in the last 24 hours. Consent obtained for transfusion of packed red blood cells by nurse. Patient has required low-dose Neo-Synephrine since yesterday before bleeding source was identified.  ROS: Unobtainable as the patient is currently intubated.  VITAL SIGNS: Temp:  [96.9 F (36.1 C)-98.6 F (37 C)] 97 F (36.1 C) (08/31 0833) Pulse Rate:  [35-112] 102 (08/31 0900) Resp:  [14-25] 23 (08/31 0900) BP: (93-137)/(43-79) 108/61 mmHg (08/31 0900) SpO2:  [90 %-100 %] 100 % (08/31 0900) FiO2 (%):  [40 %] 40 % (08/31 0721) HEMODYNAMICS:   VENTILATOR SETTINGS: Vent Mode:  [-] PRVC FiO2 (%):  [40 %] 40 % Set Rate:  [12 bmp] 12 bmp Vt Set:  [400 mL] 400 mL PEEP:  [5 cmH20] 5 cmH20 Plateau Pressure:  [18 cmH20-22 cmH20] 18 cmH20 INTAKE / OUTPUT:  Intake/Output Summary (Last 24 hours) at 06/12/15 0933 Last data filed at 06/12/15 0800  Gross per 24 hour  Intake 3154.88 ml  Output   4005 ml  Net -850.12 ml    PHYSICAL  EXAMINATION: General:  Elderly female. No distress. Eyes open. Neuro: Spontaneously moving all 4 extremities. Withdraws to pain all 4 extremities. Gives thumbs up on right hand but not left. Cardiovascular:  A. fib on tele. No edema. Unable to appreciate JVD. Lungs: Coarse breath sounds. Symmetric chest wall rise on ventilator . Abdomen: Abdominal incision with staples. Hypoactive bowel sounds. Hematoma palpated to the left of abdominal incision. Musculoskeletal: No joint deformity or effusion appreciated. Skin: Warm and dry. No rash on exposed skin. Right IJ CVC in place. Abdominal incision clean, dry, & in-tact.  LABS:  CBC  Recent Labs Lab 06/11/15 0406 06/11/15 1355 06/12/15 0423 06/12/15 0540  WBC 13.3*  --  13.6* 13.1*  HGB 7.9* 7.1* 6.8* 6.9*  HCT 23.6* 20.8* 20.5* 20.4*  PLT 210  --  205 191   Coag's  Recent Labs Lab 06/12/2015 0430  APTT 29  INR 1.37   BMET  Recent Labs Lab 06/10/15 0438 06/11/15 0406 06/12/15 0423  NA 135 131* 133*  K 4.7 4.0 3.8  CL 110 105 106  CO2 20* 22 22  BUN 7 5* <5*  CREATININE 0.78 0.82 0.79  GLUCOSE 123* 124* 184*   Electrolytes  Recent Labs Lab 06/10/15 0438 06/11/15 0406 06/12/15 0423  CALCIUM 8.4* 8.0* 8.1*  MG 1.8 1.7 1.9  PHOS 2.4* 2.5 2.7   Sepsis Markers  Recent Labs Lab 06-12-2015 1100 06/07/15 2238 06/11/15 0935 06/12/15 0423  LATICACIDVEN 3.0* 1.8  --   --  PROCALCITON  --   --  0.40 0.52   ABG  Recent Labs Lab 06/18/2015 0950 07/09/2015 1359 06/08/15 0625  PHART 7.334* 7.336* 7.411  PCO2ART 33.3* 34.2* 28.5*  PO2ART 181.0* 118.0* 114*   Liver Enzymes  Recent Labs Lab 06/16/2015 0430 06/11/15 0406 06/12/15 0423  AST 40  --   --   ALT 12*  --   --   ALKPHOS 46  --   --   BILITOT 0.9  --   --   ALBUMIN 2.3* 1.5* 1.4*   Cardiac Enzymes  Recent Labs Lab 07/06/2015 1100  TROPONINI 0.07*   Glucose  Recent Labs Lab 06/11/15 1243 06/11/15 1639 06/11/15 2011 06/11/15 2346  06/12/15 0350 06/12/15 0829  GLUCAP 128* 90 99 111* 127* 120*    Imaging No results found.   ASSESSMENT / PLAN:  PULMONARY OETT 8/25>> A: VDRF S/P Ex Lap Mucus Plugging - resolved   P:   Vent bundle SBT daily Degree of hypoxia & mental status main barriers to extubation Holding on Diuresis given hypotension Xopenex neb q6hr for mucociliary clearance Chest PT via bed qid  CARDIOVASCULAR CVL 8/25 rt i j >> A:  Hypotension - Secondary to blood loss A Fib w/ RVR - deemed not a candidate for anticoagulation due to falls but on heparin per surgery H/O HTN Mild AS & MS on TTE  P:  Cardiology following Digoxin IV daily Lopressor IV every 4 hours Avoiding amiodarone given potential for cardioversion D/C Heparin gtt due to abdominal wall hematoma Holding Diuresis  RENAL A:  Hypomagnesemia - resolved Hypokalemia - resolved. Hypophosphatemia - resolved  P:   Monitor electrolytes daily  Trend daily BUN/creatinine along with urine output  GASTROINTESTINAL A:  S/P Ex Lap POD #4 Superior Mesenteric Artery Thrombus C diff Colonization vs Infection w/ Diarrhea Abdominal Wall Hematoma  P:   Protonix IV daily NPO Tube feedings versus TPN per vascular surgery See ID Section  HEMATOLOGIC A:   Leukocytosis - Likely reactive Anemia - due to abdominal wall hematoma  P:  Repeat Hgb/Hct @ 1500 today Holding heparin gtt due to hematoma Monitoring daily hemoglobin with CBC Threshold for transfusion PRBC Hgb <8.0 or hypotension w/ blood loss SCDs for DVT ppx  INFECTIOUS A:   S/P Ex Lap C diff Colonization vs Infection  P:   Monitor for fever & plan for panculture for temp greater than 38.0 C Trending leukocytosis Flagyl IV & Vanco PO Procalcitonin Algorithm  C diff toxin 8/29>>negative C diff antigen 8/29>>positive C diff PCR 8/29>>positive  Abx:  Vanco PO 8/30>> Flagyl 8/30>> 8/25 zoysn>>8/25 8/25 zinacef>>8/26  ENDOCRINE A:   H/O DM  P:    Sliding-scale insulin per algorithm Accu-Cheks every 6 hours  NEUROLOGIC A:   H/O Dementia Postoperative abdominal pain  P:   RASS goal: 0 to -1 Ativan IV intermittent prn Fentanyl IV prn   FAMILY  - Updates: Benita Banks updated via phone today.  - Inter-disciplinary family meet or Palliative Care meeting due by:  9/1   TODAY'S SUMMARY: 81 yo AAM with history of dementia/falls, Afib but not on anticoagulation due to falls, who presented to [facility] 8/24 with acute abd pain. S/P thrombectomy & Ex Lap. Patient developed abdominal wall hematoma overnight. Transfusing packed red blood cells this morning. Prognosis remains guarded given her age & multiple medical problems. This was communicated to Mrs. Banks this morning.  I have spent a total of 39 minutes of critical care time today caring for this  patient & reviewing the patient's electronic medical record.  Donna Christen Jamison Neighbor, M.D. [facility] Pulmonary & Critical Care Pager:  [phone] After 3pm or if no response, call [phone]  06/12/2015, 9:33 AM

## 2015-06-13 LAB — RENAL FUNCTION PANEL
ALBUMIN: 1.6 g/dL — AB (ref 3.5–5.0)
Anion gap: 5 (ref 5–15)
BUN: 6 mg/dL (ref 6–20)
CO2: 22 mmol/L (ref 22–32)
CREATININE: 0.83 mg/dL (ref 0.44–1.00)
Calcium: 8.4 mg/dL — ABNORMAL LOW (ref 8.9–10.3)
Chloride: 109 mmol/L (ref 101–111)
Glucose, Bld: 168 mg/dL — ABNORMAL HIGH (ref 65–99)
PHOSPHORUS: 2.2 mg/dL — AB (ref 2.5–4.6)
POTASSIUM: 3.8 mmol/L (ref 3.5–5.1)
Sodium: 136 mmol/L (ref 135–145)

## 2015-06-13 LAB — GLUCOSE, CAPILLARY
GLUCOSE-CAPILLARY: 144 mg/dL — AB (ref 65–99)
Glucose-Capillary: 107 mg/dL — ABNORMAL HIGH (ref 65–99)
Glucose-Capillary: 108 mg/dL — ABNORMAL HIGH (ref 65–99)
Glucose-Capillary: 119 mg/dL — ABNORMAL HIGH (ref 65–99)
Glucose-Capillary: 145 mg/dL — ABNORMAL HIGH (ref 65–99)
Glucose-Capillary: 159 mg/dL — ABNORMAL HIGH (ref 65–99)
Glucose-Capillary: 184 mg/dL — ABNORMAL HIGH (ref 65–99)
Glucose-Capillary: 85 mg/dL (ref 65–99)

## 2015-06-13 LAB — CBC WITH DIFFERENTIAL/PLATELET
BASOS ABS: 0 10*3/uL (ref 0.0–0.1)
Basophils Relative: 0 % (ref 0–1)
Eosinophils Absolute: 0.1 10*3/uL (ref 0.0–0.7)
Eosinophils Relative: 1 % (ref 0–5)
HCT: 24 % — ABNORMAL LOW (ref 36.0–46.0)
HEMOGLOBIN: 8.3 g/dL — AB (ref 12.0–15.0)
LYMPHS PCT: 10 % — AB (ref 12–46)
Lymphs Abs: 1.5 10*3/uL (ref 0.7–4.0)
MCH: 30.4 pg (ref 26.0–34.0)
MCHC: 34.6 g/dL (ref 30.0–36.0)
MCV: 87.9 fL (ref 78.0–100.0)
MONOS PCT: 7 % (ref 3–12)
Monocytes Absolute: 1 10*3/uL (ref 0.1–1.0)
Neutro Abs: 12.2 10*3/uL — ABNORMAL HIGH (ref 1.7–7.7)
Neutrophils Relative %: 82 % — ABNORMAL HIGH (ref 43–77)
Platelets: 194 10*3/uL (ref 150–400)
RBC: 2.73 MIL/uL — AB (ref 3.87–5.11)
RDW: 13.6 % (ref 11.5–15.5)
WBC: 14.8 10*3/uL — AB (ref 4.0–10.5)

## 2015-06-13 LAB — MAGNESIUM: MAGNESIUM: 1.6 mg/dL — AB (ref 1.7–2.4)

## 2015-06-13 LAB — TYPE AND SCREEN
ABO/RH(D): O NEG
Antibody Screen: NEGATIVE
Unit division: 0

## 2015-06-13 LAB — PROCALCITONIN: Procalcitonin: 0.51 ng/mL

## 2015-06-13 MED ORDER — MAGNESIUM SULFATE 2 GM/50ML IV SOLN
2.0000 g | Freq: Once | INTRAVENOUS | Status: AC
  Administered 2015-06-13: 2 g via INTRAVENOUS
  Filled 2015-06-13: qty 50

## 2015-06-13 MED ORDER — FUROSEMIDE 10 MG/ML IJ SOLN
20.0000 mg | Freq: Three times a day (TID) | INTRAMUSCULAR | Status: AC
Start: 2015-06-13 — End: 2015-06-14
  Administered 2015-06-13 – 2015-06-14 (×3): 20 mg via INTRAVENOUS
  Filled 2015-06-13 (×3): qty 2

## 2015-06-13 NOTE — Progress Notes (Addendum)
[facility] Progress Note  Subjective  - POD # 7  Sedated on vent  Objective Filed Vitals:   06/13/15 0854  BP:   Pulse:   Temp: 98.9 F (37.2 C)  Resp:     Intake/Output Summary (Last 24 hours) at 06/13/15 0930 Last data filed at 06/13/15 0900  Gross per 24 hour  Intake 3641.25 ml  Output   3055 ml  Net 586.25 ml   General: ill appearing elderly female sedated on vent Pulm: breaths even on vent CV: irregularly irregular Abd: soft, incision healing well. Palpable hematoma left of staple line Extremities: Dopplerable PT and DP arteries bilaterally    Assessment/Planning: 81 y.o. female is s/p: Exploratory Laparotomy; Thromboembolectomy of Superior Mesenteric Artery  7 Days Post-Op   Pulm: Still requiring vent. Hopefully can extubate soon. Per PCCM.  CV: BP stable off neo gtt. Afib not currently on anticoagulation due to history of falls. Continue to hold heparin gtt due to abdominal wall hematoma. Cardiology following Renal: Creatinine stable. Good UOP.  Heme: Hgb stable after transfusion of 1 unit pRBCs.  GI: continue tube feeds. Appreciate nutrition consult. Stress ulcer prophylaxis.  ID: C. Diff positive on oral vanc and flagyl Endocrine: SSI Neuro: still agitated requiring restraints.  Appreciate PCCM and cardiology assistance.  Interdisciplinary family meeting today at 1000.    Raymond Gurney 06/13/2015 9:30 AM --  Laboratory CBC    Component Value Date/Time   WBC 14.8* 06/13/2015 0840   HGB 8.3* 06/13/2015 0840   HCT 24.0* 06/13/2015 0840   PLT 194 06/13/2015 0840    BMET    Component Value Date/Time   NA 136 06/13/2015 0501   K 3.8 06/13/2015 0501   CL 109 06/13/2015 0501   CO2 22 06/13/2015 0501   GLUCOSE 168* 06/13/2015 0501   BUN 6 06/13/2015 0501   CREATININE 0.83 06/13/2015 0501   CALCIUM 8.4* 06/13/2015 0501   GFRNONAA >60 06/13/2015 0501   GFRAA >60 06/13/2015 0501    COAG Lab Results  Component Value  Date   INR 1.37 06/03/2015   No results found for: PTT  Antibiotics Anti-infectives    Start     Dose/Rate Route Frequency Ordered Stop   06/11/15 1600  vancomycin (VANCOCIN) 50 mg/mL oral solution 500 mg     500 mg Oral Every 6 hours 06/11/15 1420 06/25/15 1559   06/11/15 0900  metroNIDAZOLE (FLAGYL) IVPB 500 mg     500 mg 100 mL/hr over 60 Minutes Intravenous Every 8 hours 06/11/15 0800     05/15/2015 1200  cefUROXime (ZINACEF) 1.5 g in dextrose 5 % 50 mL IVPB     1.5 g 100 mL/hr over 30 Minutes Intravenous Every 12 hours 05/20/2015 0424 06/07/15 0116   06/04/2015 0200  piperacillin-tazobactam (ZOSYN) IVPB 3.375 g     3.375 g 12.5 mL/hr over 240 Minutes Intravenous To Surgery 06/07/2015 0158 05/23/2015 0330   05/25/2015 0147  vancomycin (VANCOCIN) 1 GM/200ML IVPB    Comments:  Toney Sang   : cabinet override      05/23/2015 0147 05/30/2015 0225       Maris Berger, PA-C [facility] Office: [phone] Pager: [phone] 06/13/2015 9:30 AM  Patient remains stable on ventilator Hematocrit today 24% Patient still unable to be weaned from ventilator Abdominal exam today is unremarkable with no rebound tenderness. His vision healing nicely. Both lower extremities well perfused.  Tube feeding started yesterday  Today Dr. Jamison Neighbor and I had competence with  family discussing realistic expectations. Patient is 81 years old with dementia. She is now 7 days on ventilator after acute embolus to superior mesenteric artery despite very short operative time.  We'll continue to work on weaning from ventilator with further diuresis. A. fib is better controlled with right now in the 100 100s and 10 range regarding the time. Patient seems less agitated with her family is at the bedside  I will be out of town for the next 12 days in Dr. Cari Caraway will follow her in my absence

## 2015-06-13 NOTE — Progress Notes (Signed)
PULMONARY / CRITICAL CARE MEDICINE   Name: Amber Hood MRN: [id number] DOB: Sep 30, 1928    ADMISSION DATE:  Jul 06, 2015 CONSULTATION DATE: 8/25  REFERRING MD :  Hart Rochester  CHIEF COMPLAINT:  Abd pain  INITIAL PRESENTATION: To [facility] with abd pain  HISTORY OF PRESENT ILLNESS:   81 yo AAM with history of dementia/falls, Afib but not on anticoagulation due to falls, who presented to [facility] 8/24 with acute abd pain. She was transferred to [facility] and to Care of Vascular surgery for exp lap with superior mesenteric thrombectomy. She returned from OR to SICU and PCCM asked to assist in post op management.  STUDIES:  TTE 8/28 - EF 65-70%. No regional wall motion abnormalities. Couldn't assess diastolic. Mild aortic stenosis. Mild mitral stenosis. RV normal in size and function.  SIGNIFICANT EVENTS: 8/25 - Admitted to [facility] 8/25 - S/P Ex Lap & thrombectomy for mesenteric thrombus 8/31 - 1u PRBC for abdominal wall hematoma  SUBJECTIVE: Status post transfusion of packed red blood cells for abdominal wall hematoma. No other acute events overnight. Rectal tube replaced due to ongoing diarrhea.  ROS: Unobtainable as the patient is currently intubated.  VITAL SIGNS: Temp:  [97.5 F (36.4 C)-98.9 F (37.2 C)] 98.9 F (37.2 C) (09/01 0854) Pulse Rate:  [61-145] 88 (09/01 1118) Resp:  [7-30] 25 (09/01 1118) BP: (82-151)/(33-107) 127/47 mmHg (09/01 1118) SpO2:  [100 %] 100 % (09/01 1118) FiO2 (%):  [40 %] 40 % (09/01 1118) Weight:  [155 lb 10.3 oz (70.6 kg)] 155 lb 10.3 oz (70.6 kg) (09/01 0200) HEMODYNAMICS:   VENTILATOR SETTINGS: Vent Mode:  [-] PRVC FiO2 (%):  [40 %] 40 % Set Rate:  [12 bmp] 12 bmp Vt Set:  [400 mL] 400 mL PEEP:  [5 cmH20] 5 cmH20 Plateau Pressure:  [15 cmH20-25 cmH20] 25 cmH20 INTAKE / OUTPUT:  Intake/Output Summary (Last 24 hours) at 06/13/15 1130 Last data filed at 06/13/15 0950  Gross per 24 hour  Intake 3756.25 ml  Output   2155 ml  Net 1601.25 ml    PHYSICAL  EXAMINATION: General:  Elderly female. No distress. Eyes closed until stimulated and then will open. Neuro: Spontaneously moving all 4 extremities. Withdraws to pain all 4 extremities. Not following commands. Cardiovascular:  A. fib on tele. Normal rate. No edema. Unable to appreciate JVD. Lungs: Coarse breath sounds. Symmetric chest wall rise on ventilator . Abdomen: Abdominal incision with staples intact. Normoactive bowel sounds. Hematoma palpated to the left of abdominal incision relatively unchanged from yesterday. Musculoskeletal: No joint deformity or effusion appreciated. Skin: Warm and dry. No rash on exposed skin. Right IJ CVC in place. Abdominal incision clean, dry, & in-tact.  LABS:  CBC  Recent Labs Lab 06/12/15 0423 06/12/15 0540 06/12/15 1446 06/13/15 0840  WBC 13.6* 13.1*  --  14.8*  HGB 6.8* 6.9* 8.1* 8.3*  HCT 20.5* 20.4* 23.8* 24.0*  PLT 205 191  --  194   Coag's No results for input(s): APTT, INR in the last 168 hours. BMET  Recent Labs Lab 06/11/15 0406 06/12/15 0423 06/13/15 0501  NA 131* 133* 136  K 4.0 3.8 3.8  CL 105 106 109  CO2 BUN 5* <5* 6  CREATININE 0.82 0.79 0.83  GLUCOSE 124* 184* 168*   Electrolytes  Recent Labs Lab 06/11/15 0406 06/12/15 0423 06/13/15 0501  CALCIUM 8.0* 8.1* 8.4*  MG 1.7 1.9 1.6*  PHOS 2.5 2.7 2.2*   Sepsis Markers  Recent Labs Lab 06/07/15 2238 06/11/15  0935 06/12/15 0423 06/13/15 0501  LATICACIDVEN 1.8  --   --   --   PROCALCITON  --  0.40 0.52 0.51   ABG  Recent Labs Lab 05/18/2015 1359 06/08/15 0625  PHART 7.336* 7.411  PCO2ART 34.2* 28.5*  PO2ART 118.0* 114*   Liver Enzymes  Recent Labs Lab 06/11/15 0406 06/12/15 0423 06/13/15 0501  ALBUMIN 1.5* 1.4* 1.6*   Cardiac Enzymes No results for input(s): TROPONINI, PROBNP in the last 168 hours. Glucose  Recent Labs Lab 06/12/15 1254 06/12/15 1534 06/12/15 1945 06/13/15 0046 06/13/15 0459 06/13/15 0832  GLUCAP 129*  108* 119* 159* 145* 144*    Imaging No results found.   ASSESSMENT / PLAN:  PULMONARY OETT 8/25>> A: VDRF S/P Ex Lap Mucus Plugging - resolved  Failed SBT with tidal vol ~200 & RR 40s  P:   Vent bundle SBT daily Degree of hypoxia & mental status main barriers to extubation Xopenex neb q6hr for mucociliary clearance Chest PT via bed qid Restarting Diuresis today  CARDIOVASCULAR CVL 8/25 rt i j >> A:  Hypotension - Resolved. Secondary to blood loss. A Fib w/ RVR - deemed not a candidate for anticoagulation due to falls but on heparin per surgery H/O HTN Mild AS & MS on TTE  P:  Cardiology following Digoxin IV daily Lopressor IV every 4 hours Avoiding amiodarone given potential for cardioversion Off Heparin gtt due to abdominal wall hematoma Restart Diuresis w/ Lasix IV q8hr  RENAL A:  Hypomagnesemia - replacing IV Hypokalemia - resolved. Hypophosphatemia - resolved  P:   Diuresis w/ Lasix IV q8hr Monitor electrolytes daily  Trend daily BUN/creatinine along with urine output  GASTROINTESTINAL A:  S/P Ex Lap POD #7 Superior Mesenteric Artery Thrombus C diff Colonization vs Infection w/ Diarrhea Abdominal Wall Hematoma  P:   Protonix IV daily Tube feeds per dietary See ID Section  HEMATOLOGIC A:   Leukocytosis - Likely reactive Anemia - due to abdominal wall hematoma  P:  Holding heparin gtt due to hematoma Monitoring daily hemoglobin with CBC Threshold for transfusion PRBC Hgb <8.0 or hypotension w/ blood loss SCDs for DVT ppx  INFECTIOUS A:   S/P Ex Lap C diff Colonization vs Infection  P:   Monitor for fever & plan for panculture for temp greater than 38.0 C Trending leukocytosis Flagyl IV & Vanco PO Procalcitonin Algorithm  C diff toxin 8/29>>negative C diff antigen 8/29>>positive C diff PCR 8/29>>positive  Abx:  Vanco PO 8/30>> Flagyl 8/30>> 8/25 zoysn>>8/25 8/25 zinacef>>8/26  ENDOCRINE A:   H/O DM  P:    Sliding-scale insulin per algorithm Accu-Cheks every 6 hours  NEUROLOGIC A:   H/O Dementia Postoperative abdominal pain  P:   RASS goal: 0 to -1 Ativan IV intermittent prn Fentanyl IV prn   FAMILY  - Updates: Family meeting held today with Dr. Hart Rochester present. Family updated regarding her current clinical state and failure to wean from ventilator.  - Inter-disciplinary family meet or Palliative Care meeting:  9/1 w/ Dr. Lysbeth Galas, Benita, & other family members.   TODAY'S SUMMARY: 81 yo AAM with history of dementia/falls, Afib but not on anticoagulation due to falls, who presented to [facility] 8/24 with acute abd pain. S/P thrombectomy & Ex Lap. Patient developed abdominal wall hematoma & now off heparin gtt. Continues to have fluid overload and failed spontaneous breathing trial. Plan for continued aggressive diuresis over the next 24-48 hours.  I have spent a total of 41 minutes of critical care  time today caring for this patient, participating in family discussion with Dr. Hart Rochester, & reviewing the patient's electronic medical record.  Donna Christen Jamison Neighbor, M.D. [facility] Pulmonary & Critical Care Pager:  [phone] After 3pm or if no response, call [phone]  06/13/2015, 11:30 AM

## 2015-06-13 NOTE — Progress Notes (Signed)
Pt was taken out of restraints for assessment.  Restraints were discontinued and mitts were attempted.  However, patient becomes too agitated and pulls at ett and OGT.  Restraints are being reapplied.  Telephone order obtained from Dr. Hart Rochester.  Pt unable to be educated - intubated and agitated with dementia.  Pt's orientation waxes and wanes. No family is present at this time.  Will discuss with family at the family meeting at 1000.  Will continue to monitor.

## 2015-06-13 NOTE — Progress Notes (Signed)
Utilization review completed.  

## 2015-06-13 DEATH — deceased

## 2015-06-14 DIAGNOSIS — E876 Hypokalemia: Secondary | ICD-10-CM

## 2015-06-14 LAB — GLUCOSE, CAPILLARY
GLUCOSE-CAPILLARY: 120 mg/dL — AB (ref 65–99)
GLUCOSE-CAPILLARY: 144 mg/dL — AB (ref 65–99)
GLUCOSE-CAPILLARY: 154 mg/dL — AB (ref 65–99)
GLUCOSE-CAPILLARY: 94 mg/dL (ref 65–99)
Glucose-Capillary: 68 mg/dL (ref 65–99)
Glucose-Capillary: 158 mg/dL — ABNORMAL HIGH (ref 65–99)
Glucose-Capillary: 135 mg/dL — ABNORMAL HIGH (ref 65–99)

## 2015-06-14 LAB — CBC WITH DIFFERENTIAL/PLATELET
BASOS PCT: 0 % (ref 0–1)
Basophils Absolute: 0 10*3/uL (ref 0.0–0.1)
EOS ABS: 0.3 10*3/uL (ref 0.0–0.7)
Eosinophils Relative: 2 % (ref 0–5)
HCT: 25.9 % — ABNORMAL LOW (ref 36.0–46.0)
HEMOGLOBIN: 8.7 g/dL — AB (ref 12.0–15.0)
LYMPHS ABS: 1.7 10*3/uL (ref 0.7–4.0)
Lymphocytes Relative: 10 % — ABNORMAL LOW (ref 12–46)
MCH: 29.7 pg (ref 26.0–34.0)
MCHC: 33.6 g/dL (ref 30.0–36.0)
MCV: 88.4 fL (ref 78.0–100.0)
MONO ABS: 1 10*3/uL (ref 0.1–1.0)
MONOS PCT: 6 % (ref 3–12)
Neutro Abs: 13 10*3/uL — ABNORMAL HIGH (ref 1.7–7.7)
Neutrophils Relative %: 82 % — ABNORMAL HIGH (ref 43–77)
Platelets: 226 10*3/uL (ref 150–400)
RBC: 2.93 MIL/uL — ABNORMAL LOW (ref 3.87–5.11)
RDW: 13.7 % (ref 11.5–15.5)
WBC: 16 10*3/uL — ABNORMAL HIGH (ref 4.0–10.5)

## 2015-06-14 LAB — RENAL FUNCTION PANEL
Albumin: 1.7 g/dL — ABNORMAL LOW (ref 3.5–5.0)
Anion gap: 5 (ref 5–15)
BUN: 7 mg/dL (ref 6–20)
CALCIUM: 8.6 mg/dL — AB (ref 8.9–10.3)
CHLORIDE: 109 mmol/L (ref 101–111)
CO2: 26 mmol/L (ref 22–32)
CREATININE: 0.81 mg/dL (ref 0.44–1.00)
GFR calc Af Amer: >60 mL/min (ref >60)
GFR calc non Af Amer: >60 mL/min (ref >60)
GLUCOSE: 106 mg/dL — AB (ref 65–99)
Phosphorus: 2.6 mg/dL (ref 2.5–4.6)
Potassium: 2.7 mmol/L — CL (ref 3.5–5.1)
SODIUM: 140 mmol/L (ref 135–145)

## 2015-06-14 LAB — BASIC METABOLIC PANEL
ANION GAP: 3 — AB (ref 5–15)
BUN: 6 mg/dL (ref 6–20)
CHLORIDE: 107 mmol/L (ref 101–111)
CO2: 25 mmol/L (ref 22–32)
Calcium: 7.6 mg/dL — ABNORMAL LOW (ref 8.9–10.3)
Creatinine, Ser: 0.79 mg/dL (ref 0.44–1.00)
GFR calc Af Amer: >60 mL/min (ref >60)
GLUCOSE: 401 mg/dL — AB (ref 65–99)
POTASSIUM: 3.3 mmol/L — AB (ref 3.5–5.1)
SODIUM: 135 mmol/L (ref 135–145)

## 2015-06-14 LAB — MAGNESIUM
MAGNESIUM: 1.6 mg/dL — AB (ref 1.7–2.4)
MAGNESIUM: 1.9 mg/dL (ref 1.7–2.4)

## 2015-06-14 MED ORDER — GLUCOSE 40 % PO GEL
1.0000 | Freq: Once | ORAL | Status: AC
  Administered 2015-06-14: 37.5 g via ORAL
  Filled 2015-06-14: qty 1

## 2015-06-14 MED ORDER — POTASSIUM CHLORIDE 10 MEQ/50ML IV SOLN
10.0000 meq | INTRAVENOUS | Status: AC
  Administered 2015-06-14 (×3): 10 meq via INTRAVENOUS
  Filled 2015-06-14 (×3): qty 50

## 2015-06-14 MED ORDER — POTASSIUM CHLORIDE 20 MEQ/15ML (10%) PO SOLN
60.0000 meq | Freq: Every day | ORAL | Status: DC
  Administered 2015-06-14 – 2015-06-16 (×3): 60 meq
  Filled 2015-06-14 (×5): qty 45

## 2015-06-14 MED ORDER — DIGOXIN 125 MCG PO TABS
0.1250 mg | ORAL_TABLET | Freq: Every day | ORAL | Status: DC
  Administered 2015-06-15 – 2015-06-19 (×5): 0.125 mg
  Filled 2015-06-14 (×5): qty 1

## 2015-06-14 MED ORDER — FUROSEMIDE 10 MG/ML IJ SOLN
20.0000 mg | Freq: Once | INTRAMUSCULAR | Status: AC
  Administered 2015-06-14: 20 mg via INTRAVENOUS
  Filled 2015-06-14: qty 2

## 2015-06-14 NOTE — Progress Notes (Signed)
   VASCULAR SURGERY ASSESSMENT & PLAN:  * 8 Days Post-Op s/p: SMA embolectomy  *  Still on vent per CCM  * Tolerating TF's  * Hypokalemia: being supplemented  SUBJECTIVE: Sedated on vent  PHYSICAL EXAM: Filed Vitals:   06/14/15 0600 06/14/15 0700 06/14/15 0754 06/14/15 0922  BP: 119/66 97/41 104/49 108/44  Pulse: 104 86 93 96  Temp:   98.7 F (37.1 C)   TempSrc:      Resp: Height:      Weight:      SpO2: 100% 100% 98% 99%   Abd: normal pitch BS Lungs: clear Incision ok  LABS: Lab Results  Component Value Date   WBC 16.0* 06/14/2015   HGB 8.7* 06/14/2015   HCT 25.9* 06/14/2015   MCV 88.4 06/14/2015   PLT 226 06/14/2015   Lab Results  Component Value Date   CREATININE 0.81 06/14/2015   Lab Results  Component Value Date   INR 1.37 05/18/2015   CBG (last 3)   Recent Labs  06/14/15 0330 06/14/15 0755 06/14/15 0845  GLUCAP 154* 68 94    Active Problems:   Acute mesenteric ischemia   Shock circulatory   Atrial fibrillation with RVR   Ventilator dependence   Respiratory failure   Respiratory failure requiring intubation    Cari Caraway Beeper: [phone] 06/14/2015

## 2015-06-14 NOTE — Progress Notes (Signed)
Patient had a hypoglycemic even with CBG of 68.  Treated with one tube of glutose gel and rechecked within 15 minutes.  Rechecked blood sugar was 95.  Will continue to assess.  Tommi Emery

## 2015-06-14 NOTE — Progress Notes (Signed)
SUBJECTIVE:  Intubated and sedated.     PHYSICAL EXAM Filed Vitals:   06/14/15 1000 06/14/15 1100 06/14/15 1140 06/14/15 1200  BP: 135/62 106/49 107/84 115/57  Pulse: 98 92 84 106  Temp:    98.3 F (36.8 C)  TempSrc:      Resp: Height:      Weight:      SpO2: 99% 100% 100% 100%   General:  No acute distress Lungs:  Clear Heart:  Irregular Abdomen:  Absent bowel sounds Extremities:  No edema   LABS:  Results for orders placed or performed during the hospital encounter of 2015/06/29 (from the past 24 hour(s))  Glucose, capillary     Status: Abnormal   Collection Time: 06/13/15  4:10 PM  Result Value Ref Range   Glucose-Capillary 107 (H) 65 - 99 mg/dL   Comment 1 Capillary Specimen    Comment 2 Notify RN   Glucose, capillary     Status: Abnormal   Collection Time: 06/13/15  7:21 PM  Result Value Ref Range   Glucose-Capillary 119 (H) 65 - 99 mg/dL   Comment 1 Capillary Specimen    Comment 2 Notify RN    Comment 3 Document in Chart   Glucose, capillary     Status: Abnormal   Collection Time: 06/13/15 11:41 PM  Result Value Ref Range   Glucose-Capillary 184 (H) 65 - 99 mg/dL   Comment 1 Capillary Specimen    Comment 2 Notify RN    Comment 3 Document in Chart   Glucose, capillary     Status: Abnormal   Collection Time: 06/14/15  3:30 AM  Result Value Ref Range   Glucose-Capillary 154 (H) 65 - 99 mg/dL   Comment 1 Capillary Specimen    Comment 2 Notify RN    Comment 3 Document in Chart   Renal function panel     Status: Abnormal   Collection Time: 06/14/15  5:00 AM  Result Value Ref Range   Sodium 140 135 - 145 mmol/L   Potassium 2.7 (LL) 3.5 - 5.1 mmol/L   Chloride 109 101 - 111 mmol/L   CO2 26 22 - 32 mmol/L   Glucose, Bld 106 (H) 65 - 99 mg/dL   BUN 7 6 - 20 mg/dL   Creatinine, Ser 1.61 0.44 - 1.00 mg/dL   Calcium 8.6 (L) 8.9 - 10.3 mg/dL   Phosphorus 2.6 2.5 - 4.6 mg/dL   Albumin 1.7 (L) 3.5 - 5.0 g/dL   GFR calc non Af Amer >60 >60 mL/min    GFR calc Af Amer >60 >60 mL/min   Anion gap 5 5 - 15  CBC with Differential/Platelet     Status: Abnormal   Collection Time: 06/14/15  5:00 AM  Result Value Ref Range   WBC 16.0 (H) 4.0 - 10.5 K/uL   RBC 2.93 (L) 3.87 - 5.11 MIL/uL   Hemoglobin 8.7 (L) 12.0 - 15.0 g/dL   HCT 09.6 (L) 04.5 - 40.9 %   MCV 88.4 78.0 - 100.0 fL   MCH 29.7 26.0 - 34.0 pg   MCHC 33.6 30.0 - 36.0 g/dL   RDW 81.1 91.4 - 78.2 %   Platelets 226 150 - 400 K/uL   Neutrophils Relative % 82 (H) 43 - 77 %   Neutro Abs 13.0 (H) 1.7 - 7.7 K/uL   Lymphocytes Relative 10 (L) 12 - 46 %   Lymphs Abs 1.7 0.7 - 4.0 K/uL  Monocytes Relative 6 3 - 12 %   Monocytes Absolute 1.0 0.1 - 1.0 K/uL   Eosinophils Relative 2 0 - 5 %   Eosinophils Absolute 0.3 0.0 - 0.7 K/uL   Basophils Relative 0 0 - 1 %   Basophils Absolute 0.0 0.0 - 0.1 K/uL  Magnesium     Status: Abnormal   Collection Time: 06/14/15  5:00 AM  Result Value Ref Range   Magnesium 1.6 (L) 1.7 - 2.4 mg/dL  Glucose, capillary     Status: None   Collection Time: 06/14/15  7:55 AM  Result Value Ref Range   Glucose-Capillary 68 65 - 99 mg/dL  Glucose, capillary     Status: None   Collection Time: 06/14/15  8:45 AM  Result Value Ref Range   Glucose-Capillary 94 65 - 99 mg/dL    Intake/Output Summary (Last 24 hours) at 06/14/15 1231 Last data filed at 06/14/15 1100  Gross per 24 hour  Intake   3440 ml  Output   5660 ml  Net  -2220 ml     ASSESSMENT AND PLAN:  ATRIAL FIB WITH RVR:  Telemetry reviewed.  Rate is controlled.  I will change to digoxin via tube.    Rollene Rotunda 06/14/2015 12:31 PM

## 2015-06-14 NOTE — Progress Notes (Signed)
CRITICAL VALUE ALERT  Critical value received:  Mag 2.7  Date of notification:  06/14/15   Time of notification:  0745   Critical value read back:Yes.    Nurse who received alert:  Catalina Lunger  MD notified (1st page):  Dr. Edilia Bo- Vasc   Time of first page:  0750   MD notified (2nd page):  Time of second page:  Responding MD:  Dr. Edilia Bo  Time MD responded:  [phone]

## 2015-06-14 NOTE — Progress Notes (Signed)
PULMONARY / CRITICAL CARE MEDICINE   Name: Amber Hood MRN: [id number] DOB: [birth year]    ADMISSION DATE:  06/07/2015 CONSULTATION DATE: 8/25  REFERRING MD :  Hart Rochester  CHIEF COMPLAINT:  Abd pain  INITIAL PRESENTATION: To [facility] with abd pain  HISTORY OF PRESENT ILLNESS:   81 yo AAM with history of dementia/falls, Afib but not on anticoagulation due to falls, who presented to [facility] 8/24 with acute abd pain. She was transferred to [facility] and to [facility] of Vascular surgery for exp lap with superior mesenteric thrombectomy. She returned from OR to SICU and PCCM asked to assist in post op management.  STUDIES:  TTE 8/28 - EF 65-70%. No regional wall motion abnormalities. Couldn't assess diastolic. Mild aortic stenosis. Mild mitral stenosis. RV normal in size and function.  SIGNIFICANT EVENTS: 8/25 - Admitted to [facility] 8/25 - S/P Ex Lap & thrombectomy for mesenteric thrombus 8/31 - 1u PRBC for abdominal wall hematoma  SUBJECTIVE: No events overnight. Didn't tolerate SBT yesterday with low TV (200cc) & high respiratory rate (40s).  ROS: Unobtainable as the patient is currently intubated and has dementia at baseline.  VITAL SIGNS: Temp:  [98 F (36.7 C)-99.6 F (37.6 C)] 98.7 F (37.1 C) (09/02 0754) Pulse Rate:  [63-119] 93 (09/02 0754) Resp:  [6-26] 24 (09/02 0754) BP: (85-150)/(37-94) 104/49 mmHg (09/02 0754) SpO2:  [91 %-100 %] 98 % (09/02 0754) FiO2 (%):  [40 %] 40 % (09/02 0754) Weight:  [148 lb 9.4 oz (67.4 kg)] 148 lb 9.4 oz (67.4 kg) (09/02 0500) HEMODYNAMICS:   VENTILATOR SETTINGS: Vent Mode:  [-] PRVC FiO2 (%):  [40 %] 40 % Set Rate:  [12 bmp] 12 bmp Vt Set:  [400 mL] 400 mL PEEP:  [5 cmH20] 5 cmH20 Plateau Pressure:  [17 cmH20-25 cmH20] 20 cmH20 INTAKE / OUTPUT:  Intake/Output Summary (Last 24 hours) at 06/14/15 0855 Last data filed at 06/14/15 0800  Gross per 24 hour  Intake   4400 ml  Output   5570 ml  Net  -1170 ml    PHYSICAL EXAMINATION: General:   Elderly female. No distress.  Neuro: Spontaneously moving all 4 extremities. Withdraws to pain all 4 extremities. Not following commands. Eyes closed forcefully. Cardiovascular:  A. fib on tele. Normal rate. No edema.  Lungs: Coarse breath sounds. Symmetric chest wall rise on ventilator. Abdomen: Abdominal incision with staples intact. Normoactive bowel sounds. Hematoma palpated to the left of abdominal incision less firm today. Musculoskeletal: No joint deformity or effusion appreciated. Skin: Warm and dry. No rash on exposed skin. Abdominal incision in tact. Mild bruising around incision.  LABS:  CBC  Recent Labs Lab 06/12/15 0540 06/12/15 1446 06/13/15 0840 06/14/15 0500  WBC 13.1*  --  14.8* 16.0*  HGB 6.9* 8.1* 8.3* 8.7*  HCT 20.4* 23.8* 24.0* 25.9*  PLT 191  --  194 226   Coag's No results for input(s): APTT, INR in the last 168 hours. BMET  Recent Labs Lab 06/12/15 0423 06/13/15 0501 06/14/15 0500  NA 133* 136 140  K 3.8 3.8 2.7*  CL 106 109 109  CO2 22 22 26   BUN <5* 6 7  CREATININE 0.79 0.83 0.81  GLUCOSE 184* 168* 106*   Electrolytes  Recent Labs Lab 06/12/15 0423 06/13/15 0501 06/14/15 0500  CALCIUM 8.1* 8.4* 8.6*  MG 1.9 1.6* 1.6*  PHOS 2.7 2.2* 2.6   Sepsis Markers  Recent Labs Lab 06/07/15 2238 06/11/15 0935 06/12/15 0423 06/13/15 0501  LATICACIDVEN 1.8  --   --   --  PROCALCITON  --  0.40 0.52 0.51   ABG  Recent Labs Lab 06/08/15 0625  PHART 7.411  PCO2ART 28.5*  PO2ART 114*   Liver Enzymes  Recent Labs Lab 06/12/15 0423 06/13/15 0501 06/14/15 0500  ALBUMIN 1.4* 1.6* 1.7*   Cardiac Enzymes No results for input(s): TROPONINI, PROBNP in the last 168 hours. Glucose  Recent Labs Lab 06/13/15 0832 06/13/15 1152 06/13/15 1610 06/13/15 1921 06/13/15 2341 06/14/15 0330  GLUCAP 144* 85 107* 119* 184* 154*    Imaging No results found.   ASSESSMENT / PLAN:  PULMONARY OETT 8/25>> A: VDRF S/P Ex Lap Mucus  Plugging - resolved  Daily SBT - Plan for SBT w/ family at bedside tomorrow  P:   Vent bundle SBT daily Degree of hypoxia & mental status main barriers to extubation Xopenex neb q6hr for mucociliary clearance Chest PT via bed qid Continuing diuresis  CARDIOVASCULAR CVL 8/25 rt i j >> A:  Hypotension - Resolved. Secondary to blood loss. A Fib w/ RVR - deemed not a candidate for anticoagulation due to falls but on heparin per surgery H/O HTN Mild AS & MS on TTE  P:  Cardiology following Digoxin IV daily Lopressor IV every 4 hours Avoiding amiodarone given potential for cardioversion Off Heparin gtt due to abdominal wall hematoma Intermittent Lasix for diuresis  RENAL A:  Hypomagnesemia - replacing IV Hypokalemia - replacing IV & via tube Hypophosphatemia - resolved  P:   Repeat BMP & Mag @ 1400 today Lasix  IV once this AM Continuing daily electrolytes Trend daily BUN/creatinine along with urine output  GASTROINTESTINAL A:  S/P Ex Lap POD #8 Superior Mesenteric Artery Thrombus C diff Colonization vs Infection w/ Diarrhea Abdominal Wall Hematoma  P:   Protonix IV daily Tube feeds per dietary See ID Section Decrease D5 1/2 NS to 50cc/hr  HEMATOLOGIC A:   Leukocytosis - Likely reactive Anemia - due to abdominal wall hematoma  P:  Holding heparin gtt due to hematoma Monitoring daily hemoglobin with CBC Threshold for transfusion PRBC Hgb <8.0 or hypotension w/ blood loss SCDs for DVT prophylaxis  INFECTIOUS A:   S/P Ex Lap C diff Colonization vs Infection  P:   Monitor for fever & plan for panculture for temp greater than 38.0 C Trending leukocytosis Flagyl IV & Vanco PO Procalcitonin Algorithm  C diff toxin 8/29>>negative C diff antigen 8/29>>positive C diff PCR 8/29>>positive  Abx:  Vanco PO 8/30>> Flagyl 8/30>> 8/25 zoysn>>8/25 8/25 zinacef>>8/26  ENDOCRINE A:   H/O DM Hypoglycemia to 68  P:   Sliding-scale insulin per  algorithm Accu-Cheks every 4 hours  NEUROLOGIC A:   H/O Dementia Postoperative abdominal pain  P:   RASS goal: 0 to -1 Ativan IV intermittent prn Fentanyl IV prn   FAMILY  - Updates: Family meeting held 9/1 with Dr. Hart Rochester present. Family updated regarding her current clinical state and failure to wean from ventilator.  - Inter-disciplinary family meet or Palliative Care meeting:  9/1 w/ Dr. Lysbeth Galas, Benita, & other family members.   TODAY'S SUMMARY: 81 yo AAM with history of dementia/falls, Afib but not on anticoagulation due to falls, who presented to [facility] 8/24 with acute abd pain. S/P thrombectomy & Ex Lap. Patient developed abdominal wall hematoma & now off heparin gtt. Replacing electrolytes from diuresis yesterday. Patient remains net positive volume status. Plan to attempt SBT tomorrow morning with family present to witness.  I have spent a total of 33 minutes of critical care time today  caring for this patient & reviewing the patient's electronic medical record.  Donna Christen Jamison Neighbor, M.D. [facility] Pulmonary & Critical Care Pager:  [phone] After 3pm or if no response, call [phone]  06/14/2015, 8:55 AM

## 2015-06-15 ENCOUNTER — Inpatient Hospital Stay (HOSPITAL_COMMUNITY): Payer: Medicare (Managed Care)

## 2015-06-15 LAB — GLUCOSE, CAPILLARY
Glucose-Capillary: 180 mg/dL — ABNORMAL HIGH (ref 65–99)
Glucose-Capillary: 170 mg/dL — ABNORMAL HIGH (ref 65–99)
Glucose-Capillary: 117 mg/dL — ABNORMAL HIGH (ref 65–99)
Glucose-Capillary: 162 mg/dL — ABNORMAL HIGH (ref 65–99)
Glucose-Capillary: 135 mg/dL — ABNORMAL HIGH (ref 65–99)

## 2015-06-15 LAB — CBC WITH DIFFERENTIAL/PLATELET
BASOS ABS: 0 10*3/uL (ref 0.0–0.1)
BASOS PCT: 0 % (ref 0–1)
EOS ABS: 0.2 10*3/uL (ref 0.0–0.7)
Eosinophils Relative: 1 % (ref 0–5)
HCT: 22.9 % — ABNORMAL LOW (ref 36.0–46.0)
HEMOGLOBIN: 7.7 g/dL — AB (ref 12.0–15.0)
Lymphocytes Relative: 9 % — ABNORMAL LOW (ref 12–46)
Lymphs Abs: 1.6 10*3/uL (ref 0.7–4.0)
MCH: 29.8 pg (ref 26.0–34.0)
MCHC: 33.6 g/dL (ref 30.0–36.0)
MCV: 88.8 fL (ref 78.0–100.0)
MONOS PCT: 6 % (ref 3–12)
Monocytes Absolute: 1 10*3/uL (ref 0.1–1.0)
NEUTROS PCT: 84 % — AB (ref 43–77)
Neutro Abs: 14.6 10*3/uL — ABNORMAL HIGH (ref 1.7–7.7)
Platelets: 206 10*3/uL (ref 150–400)
RBC: 2.58 MIL/uL — ABNORMAL LOW (ref 3.87–5.11)
RDW: 13.8 % (ref 11.5–15.5)
WBC: 17.4 10*3/uL — AB (ref 4.0–10.5)

## 2015-06-15 LAB — BASIC METABOLIC PANEL
Anion gap: 8 (ref 5–15)
BUN: 12 mg/dL (ref 6–20)
CHLORIDE: 109 mmol/L (ref 101–111)
CO2: 24 mmol/L (ref 22–32)
CREATININE: 0.82 mg/dL (ref 0.44–1.00)
Calcium: 8.4 mg/dL — ABNORMAL LOW (ref 8.9–10.3)
GFR calc Af Amer: >60 mL/min (ref >60)
GFR calc non Af Amer: >60 mL/min (ref >60)
Glucose, Bld: 142 mg/dL — ABNORMAL HIGH (ref 65–99)
Potassium: 4.7 mmol/L (ref 3.5–5.1)
SODIUM: 141 mmol/L (ref 135–145)

## 2015-06-15 LAB — RENAL FUNCTION PANEL
ALBUMIN: 1.5 g/dL — AB (ref 3.5–5.0)
ANION GAP: 4 — AB (ref 5–15)
BUN: 10 mg/dL (ref 6–20)
CALCIUM: 8.3 mg/dL — AB (ref 8.9–10.3)
CO2: 26 mmol/L (ref 22–32)
Chloride: 109 mmol/L (ref 101–111)
Creatinine, Ser: 0.77 mg/dL (ref 0.44–1.00)
GFR calc Af Amer: >60 mL/min (ref >60)
GFR calc non Af Amer: >60 mL/min (ref >60)
GLUCOSE: 196 mg/dL — AB (ref 65–99)
PHOSPHORUS: 2.4 mg/dL — AB (ref 2.5–4.6)
Potassium: 3.8 mmol/L (ref 3.5–5.1)
SODIUM: 139 mmol/L (ref 135–145)

## 2015-06-15 LAB — MAGNESIUM: MAGNESIUM: 1.8 mg/dL (ref 1.7–2.4)

## 2015-06-15 MED ORDER — FENTANYL CITRATE (PF) 100 MCG/2ML IJ SOLN
12.5000 ug | INTRAMUSCULAR | Status: DC | PRN
  Administered 2015-06-15 (×2): 12.5 ug via INTRAVENOUS
  Filled 2015-06-15 (×2): qty 2

## 2015-06-15 MED ORDER — FENTANYL CITRATE (PF) 100 MCG/2ML IJ SOLN: 12.5000 ug | INTRAMUSCULAR | Status: DC

## 2015-06-15 MED ORDER — FUROSEMIDE 10 MG/ML IJ SOLN
40.0000 mg | Freq: Three times a day (TID) | INTRAMUSCULAR | Status: AC
  Administered 2015-06-15 (×3): 40 mg via INTRAVENOUS
  Filled 2015-06-15 (×3): qty 4

## 2015-06-15 MED ORDER — FAMOTIDINE 40 MG/5ML PO SUSR
20.0000 mg | Freq: Two times a day (BID) | ORAL | Status: DC
  Administered 2015-06-15 – 2015-06-18 (×8): 20 mg
  Filled 2015-06-15 (×10): qty 2.5

## 2015-06-15 MED ORDER — ROCURONIUM BROMIDE 50 MG/5ML IV SOLN
50.0000 mg | Freq: Once | INTRAVENOUS | Status: AC
  Administered 2015-06-15: 50 mg via INTRAVENOUS
  Filled 2015-06-15: qty 5

## 2015-06-15 MED ORDER — FENTANYL CITRATE (PF) 100 MCG/2ML IJ SOLN
25.0000 ug | INTRAMUSCULAR | Status: DC | PRN
  Administered 2015-06-15 – 2015-06-19 (×6): 25 ug via INTRAVENOUS
  Filled 2015-06-15 (×6): qty 2

## 2015-06-15 MED ORDER — ETOMIDATE 2 MG/ML IV SOLN
20.0000 mg | Freq: Once | INTRAVENOUS | Status: AC
  Administered 2015-06-15: 20 mg via INTRAVENOUS

## 2015-06-15 MED ORDER — FENTANYL CITRATE (PF) 100 MCG/2ML IJ SOLN
87.5000 ug | Freq: Once | INTRAMUSCULAR | Status: AC
  Administered 2015-06-15: 90 ug via INTRAVENOUS

## 2015-06-15 MED ORDER — HALOPERIDOL LACTATE 5 MG/ML IJ SOLN
2.0000 mg | INTRAMUSCULAR | Status: DC | PRN
  Administered 2015-06-15 – 2015-06-16 (×3): 2 mg via INTRAVENOUS
  Filled 2015-06-15 (×3): qty 1

## 2015-06-15 NOTE — Progress Notes (Signed)
Called to room by RN due to inc agitation on PS trial.  On arrival pt has audible leak from ETT.  Air added to cuff, but did not hold.  Pt placed back on full support, CCM called.  ETT changed at bedside by MD.  No complications.  Pt remains on full support tolerating well.  RT will monitor.

## 2015-06-15 NOTE — Progress Notes (Signed)
   VASCULAR SURGERY ASSESSMENT & PLAN:  * 9 Days Post-Op s/p: SMA embolectomy  *  Nutrition: Tolerating tube feeds at goal rate  * Pulmonary: For attempted extubation today per CCM  * Renal function normal.   * Cardiac: AFib- on dig via feeding tube  * Thrombocytopenia: WBC increased. Low grade fever. Follow closely. May be related to c diff. Need to consider ischemic intestine if WBD increases or fever increases.   * Being treated for C. Diff (IV flagyl and Vanco - FT)  SUBJECTIVE: Arouasable  PHYSICAL EXAM: Filed Vitals:   06/15/15 0400 06/15/15 0500 06/15/15 0600 06/15/15 0700  BP: 112/56 134/65 106/58 127/51  Pulse: 87 101 94 91  Temp: 99.3 F (37.4 C)     TempSrc: Oral     Resp: Height:      Weight:  151 lb 7.3 oz (68.7 kg)    SpO2: 100% 100% 100% 100%   Abdominal incision looks fine Abdomen: normal pitch BS Feet warm   LABS: Lab Results  Component Value Date   WBC 17.4* 06/15/2015   HGB 7.7* 06/15/2015   HCT 22.9* 06/15/2015   MCV 88.8 06/15/2015   PLT 206 06/15/2015   Lab Results  Component Value Date   CREATININE 0.77 06/15/2015   Lab Results  Component Value Date   INR 1.37 05/24/2015   CBG (last 3)   Recent Labs  06/14/15 1957 06/14/15 2336 06/15/15 0408  GLUCAP 135* 144* 180*    Active Problems:   Acute mesenteric ischemia   Shock circulatory   Atrial fibrillation with RVR   Ventilator dependence   Respiratory failure   Respiratory failure requiring intubation    Cari Caraway Beeper: [phone] 06/15/2015

## 2015-06-15 NOTE — Procedures (Signed)
Intubation Procedure Note Amber Hood [id number] [birth year]  Procedure: Intubation Indications: ETT needed to be changed  Procedure Details Consent: Unable to obtain consent because of emergent medical necessity. Time Out: Verified patient identification, verified procedure, site/side was marked, verified correct patient position, special equipment/implants available, medications/allergies/relevent history reviewed, required imaging and test results available.  Performed  Maximum sterile technique was used including gloves, hand hygiene and mask.  MAC    Evaluation Hemodynamic Status: BP stable throughout; O2 sats: stable throughout Patient's Current Condition: stable Complications: No apparent complications Patient did tolerate procedure well. Chest X-ray ordered to verify placement.  CXR: pending.   [name] 06/15/2015

## 2015-06-15 NOTE — Progress Notes (Signed)
Subjective: Pt intubated   Objective: Filed Vitals:   06/15/15 0949 06/15/15 1000 06/15/15 1011 06/15/15 1137  BP:  140/78 140/78 130/73  Pulse: 106 99 116 125  Temp:      TempSrc:      Resp:  Height:      Weight:      SpO2:  98% 99% 100%   Weight change: 2 lb 13.9 oz (1.3 kg)  Intake/Output Summary (Last 24 hours) at 06/15/15 1149 Last data filed at 06/15/15 1000  Gross per 24 hour  Intake 2730.33 ml  Output   1325 ml  Net 1405.33 ml    General: Intubated and sedated   Neck:  JVP is normal Heart: Irregular rate and rhythm, without murmurs, rubs, gallops.  Lungs: Diffuse rhonchi.  Exemities:  No edema.   Neuro: Intubated and sedated     Lab Results: Results for orders placed or performed during the hospital encounter of 05/15/2015 (from the past 24 hour(s))  Glucose, capillary     Status: Abnormal   Collection Time: 06/14/15 12:16 PM  Result Value Ref Range   Glucose-Capillary 120 (H) 65 - 99 mg/dL  Basic metabolic panel     Status: Abnormal   Collection Time: 06/14/15  2:11 PM  Result Value Ref Range   Sodium 135 135 - 145 mmol/L   Potassium 3.3 (L) 3.5 - 5.1 mmol/L   Chloride 107 101 - 111 mmol/L   CO2 25 22 - 32 mmol/L   Glucose, Bld 401 (H) 65 - 99 mg/dL   BUN 6 6 - 20 mg/dL   Creatinine, Ser 8.29 0.44 - 1.00 mg/dL   Calcium 7.6 (L) 8.9 - 10.3 mg/dL   GFR calc non Af Amer >60 >60 mL/min   GFR calc Af Amer >60 >60 mL/min   Anion gap 3 (L) 5 - 15  Magnesium     Status: None   Collection Time: 06/14/15  2:11 PM  Result Value Ref Range   Magnesium 1.9 1.7 - 2.4 mg/dL  Glucose, capillary     Status: Abnormal   Collection Time: 06/14/15  4:33 PM  Result Value Ref Range   Glucose-Capillary 158 (H) 65 - 99 mg/dL   Comment 1 Notify RN   Glucose, capillary     Status: Abnormal   Collection Time: 06/14/15  7:57 PM  Result Value Ref Range   Glucose-Capillary 135 (H) 65 - 99 mg/dL  Glucose, capillary     Status: Abnormal   Collection Time: 06/14/15  11:36 PM  Result Value Ref Range   Glucose-Capillary 144 (H) 65 - 99 mg/dL   Comment 1 Notify RN    Comment 2 Document in Chart   Magnesium     Status: None   Collection Time: 06/15/15  3:55 AM  Result Value Ref Range   Magnesium 1.8 1.7 - 2.4 mg/dL  CBC with Differential/Platelet     Status: Abnormal   Collection Time: 06/15/15  3:55 AM  Result Value Ref Range   WBC 17.4 (H) 4.0 - 10.5 K/uL   RBC 2.58 (L) 3.87 - 5.11 MIL/uL   Hemoglobin 7.7 (L) 12.0 - 15.0 g/dL   HCT 56.2 (L) 13.0 - 86.5 %   MCV 88.8 78.0 - 100.0 fL   MCH 29.8 26.0 - 34.0 pg   MCHC 33.6 30.0 - 36.0 g/dL   RDW 78.4 69.6 - 29.5 %   Platelets 206 150 - 400 K/uL   Neutrophils Relative % 84 (  H) 43 - 77 %   Neutro Abs 14.6 (H) 1.7 - 7.7 K/uL   Lymphocytes Relative 9 (L) 12 - 46 %   Lymphs Abs 1.6 0.7 - 4.0 K/uL   Monocytes Relative 6 3 - 12 %   Monocytes Absolute 1.0 0.1 - 1.0 K/uL   Eosinophils Relative 1 0 - 5 %   Eosinophils Absolute 0.2 0.0 - 0.7 K/uL   Basophils Relative 0 0 - 1 %   Basophils Absolute 0.0 0.0 - 0.1 K/uL  Renal function panel     Status: Abnormal   Collection Time: 06/15/15  3:55 AM  Result Value Ref Range   Sodium 139 135 - 145 mmol/L   Potassium 3.8 3.5 - 5.1 mmol/L   Chloride 109 101 - 111 mmol/L   CO2 26 22 - 32 mmol/L   Glucose, Bld 196 (H) 65 - 99 mg/dL   BUN 10 6 - 20 mg/dL   Creatinine, Ser 1.61 0.44 - 1.00 mg/dL   Calcium 8.3 (L) 8.9 - 10.3 mg/dL   Phosphorus 2.4 (L) 2.5 - 4.6 mg/dL   Albumin 1.5 (L) 3.5 - 5.0 g/dL   GFR calc non Af Amer >60 >60 mL/min   GFR calc Af Amer >60 >60 mL/min   Anion gap 4 (L) 5 - 15  Glucose, capillary     Status: Abnormal   Collection Time: 06/15/15  4:08 AM  Result Value Ref Range   Glucose-Capillary 180 (H) 65 - 99 mg/dL  Glucose, capillary     Status: Abnormal   Collection Time: 06/15/15  7:43 AM  Result Value Ref Range   Glucose-Capillary 170 (H) 65 - 99 mg/dL    Studies/Results: No results found.  Medications:  Reviewed  @PROBHOSP @  Recent events noted  Pt reintubated. Rates have been increased over the past short time  Had been better controlled earlier this am\ I would not change meds for now  Continue and and will follow as pt regroups from recent intervention.    LOS: 9 days   Dietrich Pates 06/15/2015, 11:49 AM

## 2015-06-15 NOTE — Progress Notes (Signed)
PULMONARY / CRITICAL CARE MEDICINE   Name: Amber Hood MRN: [id number] DOB: 05/17/1928    ADMISSION DATE:  06/12/15 CONSULTATION DATE: 8/25  REFERRING MD :  Hart Rochester  CHIEF COMPLAINT:  Abd pain  INITIAL PRESENTATION: To [facility] with abd pain  HISTORY OF PRESENT ILLNESS:   81 yo AAM with history of dementia/falls, Afib but not on anticoagulation due to falls, who presented to [facility] 8/24 with acute abd pain. She was transferred to [facility] and to Care of Vascular surgery for exp lap with superior mesenteric thrombectomy. She returned from OR to SICU and PCCM asked to assist in post op management.  STUDIES:  TTE 8/28 - EF 65-70%. No regional wall motion abnormalities. Couldn't assess diastolic. Mild aortic stenosis. Mild mitral stenosis. RV normal in size and function.  SIGNIFICANT EVENTS: 8/25 - Admitted to [facility] 8/25 - S/P Ex Lap & thrombectomy for mesenteric thrombus 8/31 - 1u PRBC for abdominal wall hematoma 9/2 failed sbt, lasix    SUBJECTIVE:   VITAL SIGNS: Temp:  [98.3 F (36.8 C)-99.3 F (37.4 C)] 99.3 F (37.4 C) (09/03 0400) Pulse Rate:  [81-124] 104 (09/03 0812) Resp:  [14-29] 22 (09/03 0812) BP: (101-142)/(44-96) 138/59 mmHg (09/03 0812) SpO2:  [97 %-100 %] 97 % (09/03 0812) FiO2 (%):  [30 %-40 %] 30 % (09/03 0812) Weight:  [68.7 kg (151 lb 7.3 oz)] 68.7 kg (151 lb 7.3 oz) (09/03 0500) HEMODYNAMICS:   VENTILATOR SETTINGS: Vent Mode:  [-] PRVC FiO2 (%):  [30 %-40 %] 30 % Set Rate:  [12 bmp] 12 bmp Vt Set:  [400 mL] 400 mL PEEP:  [5 cmH20] 5 cmH20 Pressure Support:  [12 cmH20] 12 cmH20 Plateau Pressure:  [19 cmH20-27 cmH20] 27 cmH20 INTAKE / OUTPUT:  Intake/Output Summary (Last 24 hours) at 06/15/15 0848 Last data filed at 06/15/15 0700  Gross per 24 hour  Intake 2820.83 ml  Output   1995 ml  Net 825.83 ml    PHYSICAL EXAMINATION: General:  Elderly female. No distress.  Neuro: Spontaneously moving all 4 extremities. Withdraws to pain all 4 extremities.  Not following commands. Eyes closed forcefully. Localizes  Cardiovascular:  A. fib on tele. Normal rate. No edema.  Lungs: clear. Symmetric chest wall rise on ventilator. F/Vt on PSV 5: 71 Abdomen: Abdominal incision with staples intact. Normoactive bowel sounds. Hematoma palpated to the left of abdominal incision less firm today. Musculoskeletal: No joint deformity or effusion appreciated. Skin: Warm and dry. No rash on exposed skin. Abdominal incision in tact. Mild bruising around incision.  LABS:  CBC  Recent Labs Lab 06/13/15 0840 06/14/15 0500 06/15/15 0355  WBC 14.8* 16.0* 17.4*  HGB 8.3* 8.7* 7.7*  HCT 24.0* 25.9* 22.9*  PLT 194 226 206   Coag's No results for input(s): APTT, INR in the last 168 hours. BMET  Recent Labs Lab 06/14/15 0500 06/14/15 1411 06/15/15 0355  NA 140 135 139  K 2.7* 3.3* 3.8  CL 109 107 109  CO2 26 25 26   BUN 7 6 10   CREATININE 0.81 0.79 0.77  GLUCOSE 106* 401* 196*   Electrolytes  Recent Labs Lab 06/13/15 0501 06/14/15 0500 06/14/15 1411 06/15/15 0355  CALCIUM 8.4* 8.6* 7.6* 8.3*  MG 1.6* 1.6* 1.9 1.8  PHOS 2.2* 2.6  --  2.4*   Sepsis Markers  Recent Labs Lab 06/11/15 0935 06/12/15 0423 06/13/15 0501  PROCALCITON 0.40 0.52 0.51   ABG No results for input(s): PHART, PCO2ART, PO2ART in the last 168 hours. Liver Enzymes  Recent Labs Lab 06/13/15 0501 06/14/15 0500 06/15/15 0355  ALBUMIN 1.6* 1.7* 1.5*   Cardiac Enzymes No results for input(s): TROPONINI, PROBNP in the last 168 hours. Glucose  Recent Labs Lab 06/14/15 1216 06/14/15 1633 06/14/15 1957 06/14/15 2336 06/15/15 0408 06/15/15 0743  GLUCAP 120* 158* 135* 144* 180* 170*    Imaging No results found.   ASSESSMENT / PLAN:  PULMONARY OETT 8/25>> A: VDRF S/P Ex Lap Mucus Plugging - resolved She has acceptable Ve for her TBW but her MS is concerning. Review of med records shows sig PRN fent administered.  P:   Vent bundle SBT  daily Xopenex neb q6hr for mucociliary clearance Chest PT via bed qid Continuing diuresis -->escalate and aim for negative balance  Decrease fentanyl   CARDIOVASCULAR CVL 8/25 rt i j >> A:  Hypotension - Resolved. Secondary to blood loss. A Fib w/ RVR - deemed not a candidate for anticoagulation due to falls but on heparin per surgery H/O HTN Mild AS & MS on TTE  P:  Cardiology following Digoxin IV daily Lopressor IV every 4 hours Avoiding amiodarone given potential for cardioversion Off Heparin gtt due to abdominal wall hematoma Intermittent Lasix for diuresis (see above)  RENAL A:  Intermittent Hypomagnesemia Intermittent Hypokalemia  Intermittent Hypophosphatemia P:   Replace lytes as needed Strict I&O Trend daily BUN/creatinine along with urine output  GASTROINTESTINAL A:  S/P Ex Lap POD #9 Superior Mesenteric Artery Thrombus C diff Colonization vs Infection w/ Diarrhea Abdominal Wall Hematoma  P:   Protonix to VT pepcid Tube feeds per dietary See ID Section   HEMATOLOGIC A:   Leukocytosis - Likely reactive Anemia - due to abdominal wall hematoma  P:  Holding heparin gtt due to hematoma Monitoring daily hemoglobin with CBC Threshold for transfusion PRBC Hgb <8.0 or hypotension w/ blood loss SCDs for DVT prophylaxis  INFECTIOUS A:   S/P Ex Lap C diff Colonization vs Infection  P:   Monitor for fever & plan for panculture for temp greater than 38.0 C Trending leukocytosis Flagyl IV & Vanco PO Procalcitonin Algorithm  C diff toxin 8/29>>negative C diff antigen 8/29>>positive C diff PCR 8/29>>positive  Abx:  Vanco PO 8/30>> Flagyl 8/30>> 8/25 zoysn>>8/25 8/25 zinacef>>8/26  ENDOCRINE A:   H/O DM Hypoglycemia to 68 on 9/2  P:   Sliding-scale insulin per algorithm Accu-Checks every 4 hours  NEUROLOGIC A:   H/O Dementia Postoperative abdominal pain  P:   RASS goal: 0 to -1 Ativan IV intermittent prn-->dc Fentanyl IV  prn-->change dosing to 12.5 mcgs  Will add low dose haldol given h/o dementia and intermittent episodes of agitation.    FAMILY  - Updates: Family meeting held 9/1 with Amber. Hart Rochester present. Family updated regarding her current clinical state and failure to wean from ventilator.  - Inter-disciplinary family meet or Palliative Care meeting:  9/1 w/ Amber. Lysbeth Galas, Benita, & other family members.   TODAY'S SUMMARY: 81 yo AAM with history of dementia/falls, Afib but not on anticoagulation due to falls, who presented to [facility] 8/24 with acute abd pain. S/P thrombectomy & Ex Lap. Patient developed abdominal wall hematoma & now off heparin gtt. Still sedated. Not following commands. Remains w/ positive fluid balance. Goal today is to cont PSV, lighten sedation and get a negative balance. She is not ready for extubation currently but may be ready later this afternoon  Amber Hood ACNP-BC [facility] Pulmonary/Critical Care Pager # [phone] OR # [phone] if no answer  Attending  Note  81 year old female with history of dementia presenting with acute abdominal pain found to have a mesenteric thrombus, s/p ex lap and thrombectomy complicated by an abdominal wall hematoma.  She remains in positive fluid balance and unresponsive, not following commands but not sure if ever will at this point.  Lighten sedation, once pressors are off will need diureses.  Will re-evaluate in the afternoon.  The patient is critically ill with multiple organ systems failure and requires high complexity decision making for assessment and support, frequent evaluation and titration of therapies, application of advanced monitoring technologies and extensive interpretation of multiple databases.   Critical Care Time devoted to patient care services described in this note is  35  Minutes. This time reflects time of care of this signee Amber Hood. This critical care time does not reflect procedure time, or teaching time or  supervisory time of PA/NP/Med student/Med Resident etc but could involve care discussion time.  Amber Hood, M.D. [facility] Pulmonary/Critical Care Medicine. Pager: [phone]. After hours pager: [phone].

## 2015-06-15 NOTE — Progress Notes (Signed)
Pt placed on PSV trial 5/5 at 0900 per RN.  Pt became agitated at this time with inc WOB, dec VT 200, inc PS to 10, pt tolerating well at this time, RT will monitor

## 2015-06-16 ENCOUNTER — Inpatient Hospital Stay (HOSPITAL_COMMUNITY): Payer: Medicare (Managed Care)

## 2015-06-16 ENCOUNTER — Encounter (HOSPITAL_COMMUNITY): Payer: Self-pay | Admitting: Radiology

## 2015-06-16 DIAGNOSIS — J189 Pneumonia, unspecified organism: Secondary | ICD-10-CM

## 2015-06-16 LAB — RENAL FUNCTION PANEL
ALBUMIN: 1.5 g/dL — AB (ref 3.5–5.0)
Anion gap: 6 (ref 5–15)
BUN: 14 mg/dL (ref 6–20)
CO2: 27 mmol/L (ref 22–32)
CREATININE: 0.83 mg/dL (ref 0.44–1.00)
Calcium: 8.2 mg/dL — ABNORMAL LOW (ref 8.9–10.3)
Chloride: 106 mmol/L (ref 101–111)
GFR calc Af Amer: >60 mL/min (ref >60)
GFR calc non Af Amer: >60 mL/min (ref >60)
GLUCOSE: 164 mg/dL — AB (ref 65–99)
PHOSPHORUS: 2.8 mg/dL (ref 2.5–4.6)
POTASSIUM: 4.8 mmol/L (ref 3.5–5.1)
Sodium: 139 mmol/L (ref 135–145)

## 2015-06-16 LAB — CBC WITH DIFFERENTIAL/PLATELET
BASOS ABS: 0 10*3/uL (ref 0.0–0.1)
Basophils Relative: 0 % (ref 0–1)
EOS ABS: 0 10*3/uL (ref 0.0–0.7)
Eosinophils Relative: 0 % (ref 0–5)
HEMATOCRIT: 24.3 % — AB (ref 36.0–46.0)
Hemoglobin: 8.1 g/dL — ABNORMAL LOW (ref 12.0–15.0)
LYMPHS ABS: 0.8 10*3/uL (ref 0.7–4.0)
Lymphocytes Relative: 3 % — ABNORMAL LOW (ref 12–46)
MCH: 29.7 pg (ref 26.0–34.0)
MCHC: 33.3 g/dL (ref 30.0–36.0)
MCV: 89 fL (ref 78.0–100.0)
MONO ABS: 0.8 10*3/uL (ref 0.1–1.0)
Monocytes Relative: 3 % (ref 3–12)
NEUTROS ABS: 24.6 10*3/uL — AB (ref 1.7–7.7)
Neutrophils Relative %: 94 % — ABNORMAL HIGH (ref 43–77)
PLATELETS: 251 10*3/uL (ref 150–400)
RBC: 2.73 MIL/uL — ABNORMAL LOW (ref 3.87–5.11)
RDW: 14.1 % (ref 11.5–15.5)
WBC: 26.2 10*3/uL — ABNORMAL HIGH (ref 4.0–10.5)

## 2015-06-16 LAB — GLUCOSE, CAPILLARY
GLUCOSE-CAPILLARY: 112 mg/dL — AB (ref 65–99)
GLUCOSE-CAPILLARY: 200 mg/dL — AB (ref 65–99)
GLUCOSE-CAPILLARY: 215 mg/dL — AB (ref 65–99)
Glucose-Capillary: 209 mg/dL — ABNORMAL HIGH (ref 65–99)
Glucose-Capillary: 247 mg/dL — ABNORMAL HIGH (ref 65–99)
Glucose-Capillary: 92 mg/dL (ref 65–99)

## 2015-06-16 LAB — MAGNESIUM: MAGNESIUM: 1.6 mg/dL — AB (ref 1.7–2.4)

## 2015-06-16 MED ORDER — METOPROLOL TARTRATE 25 MG PO TABS
25.0000 mg | ORAL_TABLET | Freq: Two times a day (BID) | ORAL | Status: DC
  Administered 2015-06-17: 25 mg via ORAL
  Filled 2015-06-16 (×6): qty 1

## 2015-06-16 MED ORDER — FUROSEMIDE 10 MG/ML IJ SOLN
40.0000 mg | Freq: Three times a day (TID) | INTRAMUSCULAR | Status: DC
  Filled 2015-06-16: qty 4

## 2015-06-16 MED ORDER — DEXMEDETOMIDINE HCL IN NACL 400 MCG/100ML IV SOLN
0.0000 ug/kg/h | INTRAVENOUS | Status: DC
  Administered 2015-06-16: 0.5 ug/kg/h via INTRAVENOUS
  Administered 2015-06-16: 0.7 ug/kg/h via INTRAVENOUS
  Administered 2015-06-16: 0.2 ug/kg/h via INTRAVENOUS
  Administered 2015-06-16: 0.5 ug/kg/h via INTRAVENOUS
  Administered 2015-06-17: 0.8 ug/kg/h via INTRAVENOUS
  Administered 2015-06-17: 0.5 ug/kg/h via INTRAVENOUS
  Administered 2015-06-17 – 2015-06-18 (×6): 0.8 ug/kg/h via INTRAVENOUS
  Administered 2015-06-19: 1.2 ug/kg/h via INTRAVENOUS
  Filled 2015-06-16: qty 100
  Filled 2015-06-16 (×4): qty 50
  Filled 2015-06-16: qty 100
  Filled 2015-06-16 (×9): qty 50

## 2015-06-16 MED ORDER — POTASSIUM CHLORIDE 20 MEQ/15ML (10%) PO SOLN: 40.0000 meq | Freq: Once | ORAL | Status: DC

## 2015-06-16 MED ORDER — DEXTROSE 5 % IV SOLN
30.0000 ug/min | INTRAVENOUS | Status: DC
  Administered 2015-06-16: 45 ug/min via INTRAVENOUS
  Administered 2015-06-16: 30 ug/min via INTRAVENOUS
  Administered 2015-06-17: 35 ug/min via INTRAVENOUS
  Filled 2015-06-16 (×3): qty 1

## 2015-06-16 MED ORDER — IOHEXOL 350 MG/ML SOLN
100.0000 mL | Freq: Once | INTRAVENOUS | Status: AC | PRN
  Administered 2015-06-16: 100 mL via INTRAVENOUS

## 2015-06-16 MED ORDER — MAGNESIUM SULFATE 2 GM/50ML IV SOLN
2.0000 g | Freq: Once | INTRAVENOUS | Status: AC
  Administered 2015-06-16: 2 g via INTRAVENOUS
  Filled 2015-06-16: qty 50

## 2015-06-16 NOTE — Progress Notes (Signed)
   VASCULAR SURGERY ASSESSMENT & PLAN:  * 10 Days Post-Op s/p: SMA embolectomy  * Nutrition: Tolerating tube feeds at goal rate  * Pulmonary: Failed ween yesterday  * Renal function normal.   * Cardiac: AFib- on dig via feeding tube. Still with some tachycaridia.   * Leukocytosis: WBC increased. Low grade fever. May need CT of abdomen.   * Being treated for C. Diff (IV flagyl and Vanco - FT)  SUBJECTIVE: Sedated on vent.   PHYSICAL EXAM: Filed Vitals:   06/16/15 0200 06/16/15 0300 06/16/15 0400 06/16/15 0500  BP: 138/72 123/65 146/107 127/45  Pulse: 124 104 135 113  Temp:   99.3 F (37.4 C)   TempSrc:   Oral   Resp: Height:      Weight:    137 lb 12.6 oz (62.5 kg)  SpO2: 100% 100% 100% 100%   Abdomen soft. Some BS.  Lungs clear.   LABS: Lab Results  Component Value Date   WBC 26.2* 06/16/2015   HGB 8.1* 06/16/2015   HCT 24.3* 06/16/2015   MCV 89.0 06/16/2015   PLT 251 06/16/2015   Lab Results  Component Value Date   CREATININE 0.83 06/16/2015   Lab Results  Component Value Date   INR 1.37 05/27/2015   CBG (last 3)   Recent Labs  06/15/15 1955 06/16/15 0005 06/16/15 0401  GLUCAP 135* 92 112*    Active Problems:   Acute mesenteric ischemia   Shock circulatory   Atrial fibrillation with RVR   Ventilator dependence   Respiratory failure   Respiratory failure requiring intubation    Cari Caraway Beeper: [phone] 06/16/2015

## 2015-06-16 NOTE — Progress Notes (Signed)
Subjective: Pt intubated   Objective: Filed Vitals:   06/16/15 0400 06/16/15 0500 06/16/15 0700 06/16/15 0758  BP: 146/107 127/45 121/51 162/117  Pulse: 135 113 95 120  Temp: 99.3 F (37.4 C)     TempSrc: Oral     Resp: 24 20 20 30   Height:      Weight:  137 lb 12.6 oz (62.5 kg)    SpO2: 100% 100% 100% 100%   Weight change: -13 lb 10.7 oz (-6.2 kg)  Intake/Output Summary (Last 24 hours) at 06/16/15 1129 Last data filed at 06/16/15 0700  Gross per 24 hour  Intake   1810 ml  Output   4045 ml  Net  -2235 ml   Net I/O   9.6 L positive  General:Intubated Neck:  Difficult to assess JVP Heart: Irregular rate and rhythm, without murmurs, rubs, gallops.  Lungs:Course rhonchi Ext:  No edema  Warm  Tel:  Afib  120s    Lab Results: Results for orders placed or performed during the hospital encounter of 06/11/2015 (from the past 24 hour(s))  Glucose, capillary     Status: Abnormal   Collection Time: 06/15/15 12:20 PM  Result Value Ref Range   Glucose-Capillary 117 (H) 65 - 99 mg/dL   Comment 1 Capillary Specimen    Comment 2 Notify RN   Glucose, capillary     Status: Abnormal   Collection Time: 06/15/15  4:34 PM  Result Value Ref Range   Glucose-Capillary 162 (H) 65 - 99 mg/dL   Comment 1 Capillary Specimen    Comment 2 Notify RN   Basic metabolic panel     Status: Abnormal   Collection Time: 06/15/15  6:33 PM  Result Value Ref Range   Sodium 141 135 - 145 mmol/L   Potassium 4.7 3.5 - 5.1 mmol/L   Chloride 109 101 - 111 mmol/L   CO2 24 22 - 32 mmol/L   Glucose, Bld 142 (H) 65 - 99 mg/dL   BUN 12 6 - 20 mg/dL   Creatinine, Ser 1.61 0.44 - 1.00 mg/dL   Calcium 8.4 (L) 8.9 - 10.3 mg/dL   GFR calc non Af Amer >60 >60 mL/min   GFR calc Af Amer >60 >60 mL/min   Anion gap 8 5 - 15  Glucose, capillary     Status: Abnormal   Collection Time: 06/15/15  7:55 PM  Result Value Ref Range   Glucose-Capillary 135 (H) 65 - 99 mg/dL   Comment 1 Notify RN    Comment 2 Document in  Chart   Glucose, capillary     Status: None   Collection Time: 06/16/15 12:05 AM  Result Value Ref Range   Glucose-Capillary 92 65 - 99 mg/dL   Comment 1 Notify RN    Comment 2 Document in Chart   Glucose, capillary     Status: Abnormal   Collection Time: 06/16/15  4:01 AM  Result Value Ref Range   Glucose-Capillary 112 (H) 65 - 99 mg/dL   Comment 1 Notify RN    Comment 2 Document in Chart   Magnesium     Status: Abnormal   Collection Time: 06/16/15  5:10 AM  Result Value Ref Range   Magnesium 1.6 (L) 1.7 - 2.4 mg/dL  CBC with Differential/Platelet     Status: Abnormal   Collection Time: 06/16/15  5:10 AM  Result Value Ref Range   WBC 26.2 (H) 4.0 - 10.5 K/uL   RBC 2.73 (L) 3.87 - 5.11 MIL/uL  Hemoglobin 8.1 (L) 12.0 - 15.0 g/dL   HCT 16.1 (L) 09.6 - 04.5 %   MCV 89.0 78.0 - 100.0 fL   MCH 29.7 26.0 - 34.0 pg   MCHC 33.3 30.0 - 36.0 g/dL   RDW 40.9 81.1 - 91.4 %   Platelets 251 150 - 400 K/uL   Neutrophils Relative % 94 (H) 43 - 77 %   Lymphocytes Relative 3 (L) 12 - 46 %   Monocytes Relative 3 3 - 12 %   Eosinophils Relative 0 0 - 5 %   Basophils Relative 0 0 - 1 %   Neutro Abs 24.6 (H) 1.7 - 7.7 K/uL   Lymphs Abs 0.8 0.7 - 4.0 K/uL   Monocytes Absolute 0.8 0.1 - 1.0 K/uL   Eosinophils Absolute 0.0 0.0 - 0.7 K/uL   Basophils Absolute 0.0 0.0 - 0.1 K/uL   RBC Morphology POLYCHROMASIA PRESENT   Renal function panel     Status: Abnormal   Collection Time: 06/16/15  5:10 AM  Result Value Ref Range   Sodium 139 135 - 145 mmol/L   Potassium 4.8 3.5 - 5.1 mmol/L   Chloride 106 101 - 111 mmol/L   CO2 27 22 - 32 mmol/L   Glucose, Bld 164 (H) 65 - 99 mg/dL   BUN 14 6 - 20 mg/dL   Creatinine, Ser 7.82 0.44 - 1.00 mg/dL   Calcium 8.2 (L) 8.9 - 10.3 mg/dL   Phosphorus 2.8 2.5 - 4.6 mg/dL   Albumin 1.5 (L) 3.5 - 5.0 g/dL   GFR calc non Af Amer >60 >60 mL/min   GFR calc Af Amer >60 >60 mL/min   Anion gap 6 5 - 15  Glucose, capillary     Status: Abnormal   Collection Time:  06/16/15  9:57 AM  Result Value Ref Range   Glucose-Capillary 200 (H) 65 - 99 mg/dL    Studies/Results: Dg Chest Port 1 View  06/15/2015   CLINICAL DATA:  81 year old female status post intubation. Evaluate endotracheal tube placement.  EXAM: PORTABLE CHEST - 1 VIEW  COMPARISON:  Chest x-ray 06/11/2015.  FINDINGS: An endotracheal tube is in place with tip 4.6 cm above the carina. Right IJ central venous catheter with tip terminating in the proximal superior vena cava. A nasogastric tube is seen extending into the stomach, however, the tip of the nasogastric tube extends below the lower margin of the image. Lung volumes are low. Bibasilar opacities are noted, which may reflect areas of atelectasis and/or consolidation. Overall, aeration appears very similar to yesterday's examination. Small bilateral pleural effusions. Crowding of the pulmonary vasculature, accentuated by low lung volumes. Heart size is upper limits of normal. Upper mediastinal contours are distorted by patient positioning.  IMPRESSION: 1. Support apparatus, as above. 2. Low lung volumes with persistent bibasilar atelectasis and/or consolidation, and small bilateral pleural effusions.   Electronically Signed   By: Trudie Reed M.D.   On: 06/15/2015 11:57   Dg Abd Portable 1v  06/15/2015   CLINICAL DATA:  Nasogastric tube placement  EXAM: PORTABLE ABDOMEN - 1 VIEW  COMPARISON:  05/20/2015  FINDINGS: There is a nasogastric tube with the tip projecting over the body of the stomach. There is no bowel dilatation to suggest obstruction. There is no evidence of pneumoperitoneum, portal venous gas or pneumatosis. There are no pathologic calcifications along the expected course of the ureters.There is a dextroscoliosis of the lumbar spine. Surgical staples along the midline of the abdomen.  IMPRESSION: Nasogastric tube  with the tip projecting over the body of the stomach.   Electronically Signed   By: Elige Ko   On: 06/15/2015 11:58     Medications: {Reviewed   @PROBHOSP @  1  Atrial fibrillation  Rates are faster  I would increase metoplrol through feeding tube  Keep IV for now.  Continue dig    2  Acute diastolic CHF  Agree with lasix as ordered   3.  Mild AS      LOS: 10 days   Dietrich Pates 06/16/2015, 11:29 AM

## 2015-06-16 NOTE — Progress Notes (Signed)
PULMONARY / CRITICAL CARE MEDICINE   Name: Amber Hood MRN: [id number] DOB: Jul 30, 1928    ADMISSION DATE:  05/31/2015 CONSULTATION DATE: 8/25  REFERRING MD :  Hart Rochester  CHIEF COMPLAINT:  Abd pain  INITIAL PRESENTATION: To [facility] with abd pain  HISTORY OF PRESENT ILLNESS:   81 yo AAM with history of dementia/falls, Afib but not on anticoagulation due to falls, who presented to [facility] 8/24 with acute abd pain. She was transferred to [facility] and to Care of Vascular surgery for exp lap with superior mesenteric thrombectomy. She returned from OR to SICU and PCCM asked to assist in post op management.  STUDIES:  TTE 8/28 - EF 65-70%. No regional wall motion abnormalities. Couldn't assess diastolic. Mild aortic stenosis. Mild mitral stenosis. RV normal in size and function.  SIGNIFICANT EVENTS: 8/25 - Admitted to [facility] 8/25 - S/P Ex Lap & thrombectomy for mesenteric thrombus 8/31 - 1u PRBC for abdominal wall hematoma 9/2 failed sbt, lasix  9/3 cuff blown. Re-intubated. Lasix increased. Decreased fentanyl, stopped ativan and added PRN haldol. Did OK w/ haldol.  9/4 very agitated. Failed SBT. RR 30s. Fluid balance finally >2 liters. Spiking fevers. Sputum sent. CT abd/pelvis ordered. Hypotensive after haldol. Hold lasix today. Neo gtt. precedex in order to avoid swings in agitation and sedation.   SUBJECTIVE:  Restless and agitated  VITAL SIGNS: Temp:  [98.3 F (36.8 C)-99.3 F (37.4 C)] 99.3 F (37.4 C) (09/04 0400) Pulse Rate:  [90-135] 120 (09/04 0758) Resp:  [13-30] 30 (09/04 0758) BP: (93-169)/(43-117) 162/117 mmHg (09/04 0758) SpO2:  [97 %-100 %] 100 % (09/04 0758) FiO2 (%):  [40 %-50 %] 40 % (09/04 0758) Weight:  [62.5 kg (137 lb 12.6 oz)] 62.5 kg (137 lb 12.6 oz) (09/04 0500) HEMODYNAMICS:   VENTILATOR SETTINGS: Vent Mode:  [-] PRVC FiO2 (%):  [40 %-50 %] 40 % Set Rate:  [12 bmp] 12 bmp Vt Set:  [400 mL] 400 mL PEEP:  [5 cmH20] 5 cmH20 Plateau Pressure:  [18 cmH20-23  cmH20] 23 cmH20 INTAKE / OUTPUT:  Intake/Output Summary (Last 24 hours) at 06/16/15 1030 Last data filed at 06/16/15 0700  Gross per 24 hour  Intake   1885 ml  Output   4295 ml  Net  -2410 ml    PHYSICAL EXAMINATION: General:  Elderly female. Restless and agitated  Neuro: Spontaneously moving all 4 extremities. Awake and agitated.  Cardiovascular:  A. fib on tele. Normal rate. No edema.  Lungs:  Symmetric chest wall rise on ventilator. Scattered rhonchi  Abdomen: Abdominal incision with staples intact. Normoactive bowel sounds. Hematoma palpated to the left of abdominal incision less firm today. Musculoskeletal: No joint deformity or effusion appreciated. Skin: Warm and dry. No rash on exposed skin. Abdominal incision in tact. Mild bruising around incision.  LABS:  CBC  Recent Labs Lab 06/14/15 0500 06/15/15 0355 06/16/15 0510  WBC 16.0* 17.4* 26.2*  HGB 8.7* 7.7* 8.1*  HCT 25.9* 22.9* 24.3*  PLT 226 206 251   Coag's No results for input(s): APTT, INR in the last 168 hours. BMET  Recent Labs Lab 06/15/15 0355 06/15/15 1833 06/16/15 0510  NA 139 141 139  K 3.8 4.7 4.8  CL 109 109 106  CO2 BUN CREATININE 0.77 0.82 0.83  GLUCOSE 196* 142* 164*   Electrolytes  Recent Labs Lab 06/14/15 0500 06/14/15 1411 06/15/15 0355 06/15/15 1833 06/16/15 0510  CALCIUM 8.6* 7.6* 8.3* 8.4* 8.2*  MG 1.6* 1.9  1.8  --  1.6*  PHOS 2.6  --  2.4*  --  2.8   Sepsis Markers  Recent Labs Lab 06/11/15 0935 06/12/15 0423 06/13/15 0501  PROCALCITON 0.40 0.52 0.51   ABG No results for input(s): PHART, PCO2ART, PO2ART in the last 168 hours. Liver Enzymes  Recent Labs Lab 06/14/15 0500 06/15/15 0355 06/16/15 0510  ALBUMIN 1.7* 1.5* 1.5*   Cardiac Enzymes No results for input(s): TROPONINI, PROBNP in the last 168 hours. Glucose  Recent Labs Lab 06/15/15 1220 06/15/15 1634 06/15/15 1955 06/16/15 0005 06/16/15 0401 06/16/15 0957  GLUCAP  117* 162* 135* 92 112* 200*    Imaging Dg Chest Port 1 View  06/15/2015   CLINICAL DATA:  81 year old female status post intubation. Evaluate endotracheal tube placement.  EXAM: PORTABLE CHEST - 1 VIEW  COMPARISON:  Chest x-ray 06/11/2015.  FINDINGS: An endotracheal tube is in place with tip 4.6 cm above the carina. Right IJ central venous catheter with tip terminating in the proximal superior vena cava. A nasogastric tube is seen extending into the stomach, however, the tip of the nasogastric tube extends below the lower margin of the image. Lung volumes are low. Bibasilar opacities are noted, which may reflect areas of atelectasis and/or consolidation. Overall, aeration appears very similar to yesterday's examination. Small bilateral pleural effusions. Crowding of the pulmonary vasculature, accentuated by low lung volumes. Heart size is upper limits of normal. Upper mediastinal contours are distorted by patient positioning.  IMPRESSION: 1. Support apparatus, as above. 2. Low lung volumes with persistent bibasilar atelectasis and/or consolidation, and small bilateral pleural effusions.   Electronically Signed   By: Trudie Reed M.D.   On: 06/15/2015 11:57   Dg Abd Portable 1v  06/15/2015   CLINICAL DATA:  Nasogastric tube placement  EXAM: PORTABLE ABDOMEN - 1 VIEW  COMPARISON:  06/02/2015  FINDINGS: There is a nasogastric tube with the tip projecting over the body of the stomach. There is no bowel dilatation to suggest obstruction. There is no evidence of pneumoperitoneum, portal venous gas or pneumatosis. There are no pathologic calcifications along the expected course of the ureters.There is a dextroscoliosis of the lumbar spine. Surgical staples along the midline of the abdomen.  IMPRESSION: Nasogastric tube with the tip projecting over the body of the stomach.   Electronically Signed   By: Elige Ko   On: 06/15/2015 11:58     ASSESSMENT / PLAN:  PULMONARY OETT 8/25>> A: VDRF S/P Ex  Lap Mucus Plugging - resolved She has acceptable Ve for her TBW but her mental status continues to be the big barrier to weaning.  Plan Vent bundle SBT daily Xopenex neb q6hr for mucociliary clearance Chest PT via bed qid Add precedex  CARDIOVASCULAR CVL 8/25 rt i j >> A:  Hypotension - Resolved. Secondary to blood loss and sedation on 9/4 A Fib w/ RVR - deemed not a candidate for anticoagulation due to falls but on heparin per surgery H/O HTN Mild AS & MS on TTE  P:  Cardiology following Digoxin IV daily Lopressor IV every 4 hours Avoiding amiodarone given potential for cardioversion Off Heparin gtt due to abdominal wall hematoma Low dose neo to allow for agitation rx  RENAL A:  Intermittent Hypomagnesemia Intermittent Hypokalemia  Intermittent Hypophosphatemia P:   Replace lytes as needed Strict I&O Trend daily BUN/creatinine along with urine output  GASTROINTESTINAL A:  S/P Ex Lap POD #10 Superior Mesenteric Artery Thrombus C diff Colonization vs Infection w/ Diarrhea Abdominal Wall  Hematoma  P:   Protonix to VT pepcid Tube feeds per dietary See ID Section   HEMATOLOGIC A:   Leukocytosis - Likely reactive Anemia - due to abdominal wall hematoma  P:  Holding heparin gtt due to hematoma Monitoring daily hemoglobin with CBC Threshold for transfusion PRBC Hgb <8.0 or hypotension w/ blood loss SCDs for DVT prophylaxis  INFECTIOUS A:   S/P Ex Lap C diff Colonization vs Infection Fever spike and rising leukocytosis 9/4 C diff toxin 8/29>>negative C diff antigen 8/29>>positive C diff PCR 8/29>>positive P:   Monitor for fever & plan for panculture for temp greater than 38.0 C Trending leukocytosis Flagyl IV & Vanco PO Procalcitonin Algorithm Abx:  Vanco PO 8/30>> Flagyl 8/30>> 8/25 zoysn>>8/25 8/25 zinacef>>8/26 Repeat sputum 9/4>>> PCXR 9/4>>> CT abd ordered by surg to r/o abscess>>>  ENDOCRINE A:   H/O DM Hypoglycemia to 68 on 9/2  P:    Sliding-scale insulin per algorithm Accu-Checks every 4 hours  NEUROLOGIC A:   H/O Dementia Postoperative abdominal pain  P:   RASS goal: 0 to -1 Will try precedex to avoid swings in agitation and sedation   FAMILY  - Updates: Family meeting held 9/1 with Dr. Hart Rochester present. Family updated regarding her current clinical state and failure to wean from ventilator.  - Inter-disciplinary family meet or Palliative Care meeting:  9/1 w/ Dr. Lysbeth Galas, Benita, & other family members.   TODAY'S SUMMARY: 81 yo AAM with history of dementia/falls, Afib but not on anticoagulation due to falls, who [facility] with acute abd [facility]. S/P thrombectomy & Ex Lap. Patient developed abdominal wall hematoma & now off heparin gtts. Agitation and then oversedation has been her big barrier. She is finally w/ negative fluid balance. Did get hypotensive after haldol. Will support this w/short term neo. Will hold further lasix for now. Has new fever so have ordered sputum culture. CT abd/pelvis ordered by surgical team given fever and rising WBC  Simonne Martinet ACNP-BC [facility] Pulmonary/Critical Care Pager # [phone] OR # [phone] if no answer  Attending Note  81 year old female with history of dementia presenting with acute abdominal pain found to have a mesenteric thrombus, s/p ex lap and thrombectomy complicated by an abdominal wall hematoma. She remains in positive fluid balance and unresponsive, not following commands but not sure if ever will at this point. Lighten sedation.  Precedex added.  Once pressors are off will need diureses but BP precludes.  Will monitor for now.   The patient is critically ill with multiple organ systems failure and requires high complexity decision making for assessment and support, frequent evaluation and titration of therapies, application of advanced monitoring technologies and extensive interpretation of multiple databases.   Critical Care Time devoted  to patient care services described in this note is 35 Minutes. This time reflects time of care of this signee Dr [name]. This critical care time does not reflect procedure time, or teaching time or supervisory time of PA/NP/Med student/Med Resident etc but could involve care discussion time.  Alyson Reedy, M.D. [facility] Pulmonary/Critical Care Medicine. Pager: [phone]. After hours pager: [phone].

## 2015-06-17 DIAGNOSIS — R0902 Hypoxemia: Secondary | ICD-10-CM

## 2015-06-17 LAB — CBC WITH DIFFERENTIAL/PLATELET
BASOS ABS: 0 10*3/uL (ref 0.0–0.1)
Basophils Relative: 0 % (ref 0–1)
Eosinophils Absolute: 0 10*3/uL (ref 0.0–0.7)
Eosinophils Relative: 0 % (ref 0–5)
HEMATOCRIT: 22.3 % — AB (ref 36.0–46.0)
Hemoglobin: 7.3 g/dL — ABNORMAL LOW (ref 12.0–15.0)
LYMPHS ABS: 0.6 10*3/uL — AB (ref 0.7–4.0)
LYMPHS PCT: 2 % — AB (ref 12–46)
MCH: 29.6 pg (ref 26.0–34.0)
MCHC: 32.7 g/dL (ref 30.0–36.0)
MCV: 90.3 fL (ref 78.0–100.0)
MONO ABS: 0.4 10*3/uL (ref 0.1–1.0)
Monocytes Relative: 2 % — ABNORMAL LOW (ref 3–12)
NEUTROS ABS: 24 10*3/uL — AB (ref 1.7–7.7)
Neutrophils Relative %: 96 % — ABNORMAL HIGH (ref 43–77)
Platelets: 240 10*3/uL (ref 150–400)
RBC: 2.47 MIL/uL — ABNORMAL LOW (ref 3.87–5.11)
RDW: 14.2 % (ref 11.5–15.5)
WBC: 24.9 10*3/uL — ABNORMAL HIGH (ref 4.0–10.5)

## 2015-06-17 LAB — RENAL FUNCTION PANEL
Albumin: 1.5 g/dL — ABNORMAL LOW (ref 3.5–5.0)
Anion gap: 8 (ref 5–15)
BUN: 23 mg/dL — AB (ref 6–20)
CHLORIDE: 108 mmol/L (ref 101–111)
CO2: 24 mmol/L (ref 22–32)
Calcium: 8.6 mg/dL — ABNORMAL LOW (ref 8.9–10.3)
Creatinine, Ser: 1.1 mg/dL — ABNORMAL HIGH (ref 0.44–1.00)
GFR, EST AFRICAN AMERICAN: 52 mL/min — AB (ref >60)
GFR, EST NON AFRICAN AMERICAN: 44 mL/min — AB (ref >60)
Glucose, Bld: 183 mg/dL — ABNORMAL HIGH (ref 65–99)
POTASSIUM: 4.4 mmol/L (ref 3.5–5.1)
Phosphorus: 2 mg/dL — ABNORMAL LOW (ref 2.5–4.6)
Sodium: 140 mmol/L (ref 135–145)

## 2015-06-17 LAB — GLUCOSE, CAPILLARY
GLUCOSE-CAPILLARY: 187 mg/dL — AB (ref 65–99)
Glucose-Capillary: 191 mg/dL — ABNORMAL HIGH (ref 65–99)
Glucose-Capillary: 149 mg/dL — ABNORMAL HIGH (ref 65–99)
Glucose-Capillary: 169 mg/dL — ABNORMAL HIGH (ref 65–99)
Glucose-Capillary: 136 mg/dL — ABNORMAL HIGH (ref 65–99)

## 2015-06-17 LAB — MAGNESIUM: MAGNESIUM: 2.1 mg/dL (ref 1.7–2.4)

## 2015-06-17 MED ORDER — PIPERACILLIN-TAZOBACTAM 3.375 G IVPB
3.3750 g | Freq: Three times a day (TID) | INTRAVENOUS | Status: DC
  Filled 2015-06-17 (×2): qty 50

## 2015-06-17 MED ORDER — PIPERACILLIN-TAZOBACTAM 3.375 G IVPB 30 MIN
3.3750 g | Freq: Once | INTRAVENOUS | Status: DC
  Filled 2015-06-17: qty 50

## 2015-06-17 MED ORDER — DEXTROSE 5 % IV SOLN
30.0000 ug/min | INTRAVENOUS | Status: DC
  Administered 2015-06-17: 150 ug/min via INTRAVENOUS
  Administered 2015-06-18: 55 ug/min via INTRAVENOUS
  Administered 2015-06-19: 65 ug/min via INTRAVENOUS
  Filled 2015-06-17 (×6): qty 4

## 2015-06-17 MED ORDER — ACETAMINOPHEN 160 MG/5ML PO SOLN
650.0000 mg | ORAL | Status: DC | PRN
  Administered 2015-06-17 – 2015-06-18 (×2): 650 mg
  Filled 2015-06-17 (×2): qty 20.3

## 2015-06-17 MED ORDER — PIPERACILLIN-TAZOBACTAM 3.375 G IVPB
3.3750 g | Freq: Three times a day (TID) | INTRAVENOUS | Status: DC
  Administered 2015-06-18 – 2015-06-19 (×4): 3.375 g via INTRAVENOUS
  Filled 2015-06-17 (×6): qty 50

## 2015-06-17 MED ORDER — VANCOMYCIN HCL IN DEXTROSE 1-5 GM/200ML-% IV SOLN
1000.0000 mg | INTRAVENOUS | Status: DC
  Administered 2015-06-18: 1000 mg via INTRAVENOUS
  Filled 2015-06-17 (×2): qty 200

## 2015-06-17 MED ORDER — POTASSIUM CHLORIDE 20 MEQ/15ML (10%) PO SOLN
40.0000 meq | Freq: Once | ORAL | Status: AC
  Administered 2015-06-17: 40 meq
  Filled 2015-06-17: qty 30

## 2015-06-17 MED ORDER — FUROSEMIDE 10 MG/ML IJ SOLN
40.0000 mg | Freq: Three times a day (TID) | INTRAMUSCULAR | Status: AC
  Administered 2015-06-17 – 2015-06-18 (×2): 40 mg via INTRAVENOUS
  Filled 2015-06-17 (×2): qty 4

## 2015-06-17 MED ORDER — FUROSEMIDE 10 MG/ML IJ SOLN
40.0000 mg | Freq: Once | INTRAMUSCULAR | Status: AC
  Administered 2015-06-17: 40 mg via INTRAVENOUS
  Filled 2015-06-17: qty 4

## 2015-06-17 MED ORDER — PIPERACILLIN-TAZOBACTAM 3.375 G IVPB 30 MIN
3.3750 g | Freq: Once | INTRAVENOUS | Status: AC
  Administered 2015-06-17: 3.375 g via INTRAVENOUS
  Filled 2015-06-17: qty 50

## 2015-06-17 MED ORDER — VANCOMYCIN HCL 10 G IV SOLR
1250.0000 mg | Freq: Once | INTRAVENOUS | Status: AC
  Administered 2015-06-17: 1250 mg via INTRAVENOUS
  Filled 2015-06-17: qty 1250

## 2015-06-17 NOTE — Progress Notes (Signed)
Subjective: Pt intubated, sedated  Objective: Filed Vitals:   06/17/15 0630 06/17/15 0700 06/17/15 0719 06/17/15 0901  BP: 99/57 149/97 151/72   Pulse: 75 85 85 105  Temp:   97.6 F (36.4 C)   TempSrc:      Resp: 24 23 17 26   Height:      Weight:      SpO2: 100% 100% 100% 100%   Weight change: 4 lb 10.1 oz (2.1 kg)  Intake/Output Summary (Last 24 hours) at 06/17/15 0926 Last data filed at 06/17/15 0700  Gross per 24 hour  Intake 2897.72 ml  Output   1205 ml  Net 1692.72 ml    General: Intubated, sedated Neck:  JVP is normal Heart: Irregular rate and rhythm, without murmurs, rubs, gallops.  Lungs:  REl clear anteirorly   Exemities:  No edema.  Feet cool  Tele:  Afib 80s     Lab Results: Results for orders placed or performed during the hospital encounter of 05/15/2015 (from the past 24 hour(s))  Glucose, capillary     Status: Abnormal   Collection Time: 06/16/15  9:57 AM  Result Value Ref Range   Glucose-Capillary 200 (H) 65 - 99 mg/dL  Glucose, capillary     Status: Abnormal   Collection Time: 06/16/15 12:16 PM  Result Value Ref Range   Glucose-Capillary 209 (H) 65 - 99 mg/dL   Comment 1 Notify RN   Culture, respiratory (NON-Expectorated)     Status: None (Preliminary result)   Collection Time: 06/16/15 12:28 PM  Result Value Ref Range   Specimen Description TRACHEAL ASPIRATE    Special Requests NONE    Gram Stain PENDING    Culture      Culture reincubated for better growth Performed at [facility]    Report Status PENDING   Glucose, capillary     Status: Abnormal   Collection Time: 06/16/15  5:23 PM  Result Value Ref Range   Glucose-Capillary 247 (H) 65 - 99 mg/dL  Glucose, capillary     Status: Abnormal   Collection Time: 06/16/15  7:36 PM  Result Value Ref Range   Glucose-Capillary 215 (H) 65 - 99 mg/dL   Comment 1 Notify RN    Comment 2 Document in Chart   Magnesium     Status: None   Collection Time: 06/17/15  1:43 AM  Result Value  Ref Range   Magnesium 2.1 1.7 - 2.4 mg/dL  CBC with Differential/Platelet     Status: Abnormal   Collection Time: 06/17/15  1:43 AM  Result Value Ref Range   WBC 24.9 (H) 4.0 - 10.5 K/uL   RBC 2.47 (L) 3.87 - 5.11 MIL/uL   Hemoglobin 7.3 (L) 12.0 - 15.0 g/dL   HCT 98.1 (L) 19.1 - 47.8 %   MCV 90.3 78.0 - 100.0 fL   MCH 29.6 26.0 - 34.0 pg   MCHC 32.7 30.0 - 36.0 g/dL   RDW 29.5 62.1 - 30.8 %   Platelets 240 150 - 400 K/uL   Neutrophils Relative % 96 (H) 43 - 77 %   Neutro Abs 24.0 (H) 1.7 - 7.7 K/uL   Lymphocytes Relative 2 (L) 12 - 46 %   Lymphs Abs 0.6 (L) 0.7 - 4.0 K/uL   Monocytes Relative 2 (L) 3 - 12 %   Monocytes Absolute 0.4 0.1 - 1.0 K/uL   Eosinophils Relative 0 0 - 5 %   Eosinophils Absolute 0.0 0.0 - 0.7 K/uL   Basophils  Relative 0 0 - 1 %   Basophils Absolute 0.0 0.0 - 0.1 K/uL  Renal function panel     Status: Abnormal   Collection Time: 06/17/15  1:43 AM  Result Value Ref Range   Sodium 140 135 - 145 mmol/L   Potassium 4.4 3.5 - 5.1 mmol/L   Chloride 108 101 - 111 mmol/L   CO2 24 22 - 32 mmol/L   Glucose, Bld 183 (H) 65 - 99 mg/dL   BUN 23 (H) 6 - 20 mg/dL   Creatinine, Ser 1.61 (H) 0.44 - 1.00 mg/dL   Calcium 8.6 (L) 8.9 - 10.3 mg/dL   Phosphorus 2.0 (L) 2.5 - 4.6 mg/dL   Albumin 1.5 (L) 3.5 - 5.0 g/dL   GFR calc non Af Amer 44 (L) >60 mL/min   GFR calc Af Amer 52 (L) >60 mL/min   Anion gap 8 5 - 15  Glucose, capillary     Status: Abnormal   Collection Time: 06/17/15  8:30 AM  Result Value Ref Range   Glucose-Capillary 191 (H) 65 - 99 mg/dL   Comment 1 Notify RN     Studies/Results: Dg Chest Port 1 View  06/16/2015   CLINICAL DATA:  80 year old female with history of pneumonia.  EXAM: PORTABLE CHEST - 1 VIEW  COMPARISON:  Chest x-ray 06/15/2015.  FINDINGS: There is a right-sided internal jugular central venous catheter with tip terminating in the proximal superior vena cava. An endotracheal tube is in place with tip 4.8 cm above the carina. A  nasogastric tube is seen extending into the stomach, however, the tip of the nasogastric tube extends below the lower margin of the image. Lung volumes are normal. Patchy ill-defined opacities throughout the mid to lower lungs bilaterally, slightly improved compared to yesterday's examination. Small bilateral pleural effusions appear slightly decreased compared to yesterday's examination. Pulmonary vasculature does not appear engorged. Heart size is borderline enlarged. Upper mediastinal contours are distorted by patient positioning.  IMPRESSION: 1. Support apparatus, as above. 2. Slightly improved aeration, with persistent bibasilar areas of atelectasis and/or consolidation, but decreasing small bilateral pleural effusions.   Electronically Signed   By: Trudie Reed M.D.   On: 06/16/2015 15:38   Ct Angio Abd/pel W/ And/or W/o  06/16/2015   CLINICAL DATA:  81 year old female with abdominal pain. History of thrombosed superior mesenteric artery status post embolectomy on 06/12/2015. Evaluate for mesenteric ischemia.  EXAM: CTA ABDOMEN AND PELVIS WITHOUT AND WITH CONTRAST  TECHNIQUE: Multidetector CT imaging of the abdomen and pelvis was performed using the standard protocol during bolus administration of intravenous contrast. Multiplanar reconstructed images and MIPs were obtained and reviewed to evaluate the vascular anatomy.  CONTRAST:  OMNIPAQUE IOHEXOL 350 MG/ML SOLN  COMPARISON:  CT the abdomen and pelvis 06/05/2015.  FINDINGS: Lower chest: Small bilateral pleural effusions lying dependently with associated passive atelectasis in the dependent portions of the lung bases bilaterally. Cardiomegaly. Atherosclerotic calcifications in the left anterior descending, left circumflex and right coronary arteries. Calcifications of the aortic valve, mitral valve and mitral annulus.  Hepatobiliary: No cystic or solid hepatic lesions. No intra or extrahepatic biliary ductal dilatation. Status post  cholecystectomy.  Pancreas: Well-defined low-attenuation lesion measuring 1.5 cm in the tail of the pancreas (image 41 of series 501) unchanged. No other pancreatic mass. No pancreatic ductal dilatation. No peripancreatic inflammatory changes.  Spleen: Unremarkable.  Adrenals/Urinary Tract: Bilateral adrenal glands are normal in appearance. Multiple nonobstructive calculi within the left renal collecting system, largest of which measures  7 mm in the interpolar region. Sub cm low-attenuation lesions in the kidneys bilaterally, too small to definitively characterize, but statistically likely small cysts. No hydroureteronephrosis. Urinary bladder is nearly completely decompressed, with a Foley balloon catheter in place. Small amount of gas non dependently in the urinary bladder, presumably iatrogenic.  Stomach/Bowel: The appearance of the stomach is normal. No pathologic dilatation of small bowel or colon. Rectal bag in place. The mucosa of the small bowel and colon appears to enhance, without definite nonenhancing regions to suggest frank areas of ischemia. There do appear to be some mildly thickened loops of proximal ileum, which demonstrate increased enhancement, which could be a response to recently re-established blood flow.  Vascular/Lymphatic: Compared to the prior examination, blood flow has been re-established within the previously thrombosed superior mesenteric artery. 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 There is a focal intimal abnormality on image 54 of series 401, which could represent residual thrombus or postsurgical change from recent embolectomy. Images 84 and 85 of series 401 demonstrate lack of blood flow in a short segment of the superior mesenteric artery, however, blood flow appears reconstituted distally to this at the transition from images 90-91. This is indicative of either incomplete obstruction, or perfusion from  collateral vasculature. Celiac axis and inferior mesenteric artery are both patent. High-grade stenosis at the ostium of the right renal artery. Superior mesenteric vein and portal vein are widely patent. Circumaortic left renal vein (normal anatomical variant) incidentally noted. No lymphadenopathy noted in the abdomen or pelvis.  Reproductive: Status post hysterectomy. Ovaries are unremarkable in appearance.  Other: Large amount of high attenuation fluid immediately deep to the midline surgical wound, compatible with a large postoperative hematoma. This is in the subcutaneous fat of the anterior abdominal wall and measures up to 3.3 x 3.0 cm on axial images, spanning a total length of approximately 21.6 cm in the midline. Small volume of ascites. No pneumoperitoneum.  Musculoskeletal: There are no aggressive appearing lytic or blastic lesions noted in the visualized portions of the skeleton.  Review of the MIP images confirms the above findings.  IMPRESSION: 1. Status post SMA embolectomy, with restoration of flow in the superior mesenteric artery, as above. 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 The vessel is generally widely patent, with exception of a short segment which appears either partially or completely occluded. There is distal reconstitution of flow, which could be from incomplete occlusion, or from collateral flow. 2. Mucosal enhancement throughout the small bowel and colon, without definite areas suspicious for ischemia on today's examination. There are some mildly thickened and hyperenhancing loops of proximal ileum, which may be a response to recent revascularization. 3. Large hematoma deep to the skin sutures in the subcutaneous fat of the anterior abdominal wall in the midline. 4. High-grade stenosis at the ostium of the right renal artery. Clinical  correlation for signs and symptoms of renovascular hypertension is recommended. 5. Small volume of ascites. 6. Small bilateral pleural effusions lying dependently with postoperative subsegmental atelectasis in the lower lobes of the lungs bilaterally. 7. Additional incidental findings, as above.   Electronically Signed   By: Trudie Reed M.D.   On: 06/16/2015 17:43    Medications:REviewed   @PROBHOSP @  Pt remains critically ill  Intubated  Now on pressors  WBG greater than  20K  1.  Atrial fib   Rates are controlled, actually lower than on weekend Follow When/if recovers will need to review use of anticoagulation with pt and family as she is fall risk but with embolic event.   No new recommendations.      LOS: 11 days   Dietrich Pates 06/17/2015, 9:26 AM

## 2015-06-17 NOTE — Progress Notes (Signed)
   VASCULAR SURGERY ASSESSMENT & PLAN:  * 11 Days Post-Op s/p: SMA embolectomy  * Nutrition: Tolerating tube feeds at goal rate  * Pulmonary: Still on vent. Reportedly gets very agitated when sedation weened off. Consider rapid ween?  * Renal function normal.   * Cardiac: AFib- on dig via feeding tube. Cardiology following. Getting lasix for acute diastolic CHF.  * Leukocytosis: WBC slightly improved. CT of abdomen: SMA widely patent. No obvious areas of ischemia.   * Being treated for C. Diff (IV flagyl and Vanco - FT)  SUBJECTIVE: Sedated on vent.   PHYSICAL EXAM: Filed Vitals:   06/17/15 0600 06/17/15 0630 06/17/15 0700 06/17/15 0719  BP: 70/47 99/57 149/97 151/72  Pulse: 74 75 85 85  Temp:      TempSrc:      Resp: Height:      Weight:      SpO2: 100% 100% 100% 100%   Abdomen: normal bowel sounds. Lungs: rhonchi Abdominal incision ok  LABS: Lab Results  Component Value Date   WBC 24.9* 06/17/2015   HGB 7.3* 06/17/2015   HCT 22.3* 06/17/2015   MCV 90.3 06/17/2015   PLT 240 06/17/2015   Lab Results  Component Value Date   CREATININE 1.10* 06/17/2015   Lab Results  Component Value Date   INR 1.37 06/13/2015   CBG (last 3)   Recent Labs  06/16/15 1216 06/16/15 1723 06/16/15 1936  GLUCAP 209* 247* 215*    Active Problems:   Acute mesenteric ischemia   Shock circulatory   Atrial fibrillation with RVR   Ventilator dependence   Respiratory failure   Respiratory failure requiring intubation   Pneumonia    Cari Caraway Beeper: [phone] 06/17/2015

## 2015-06-17 NOTE — Progress Notes (Signed)
Spoke RN at [employer] relating to [employer] for 1200-1600, RN to relay to Dr. Tyna Jaksch S 4:37 PM

## 2015-06-17 NOTE — Progress Notes (Signed)
ANTIBIOTIC CONSULT NOTE - INITIAL  Pharmacy Consult for vancomycin, zosyn Indication: bacteremia  No Known Allergies  Patient Measurements: Height: 5' 1.42" (156 cm) Weight: 142 lb 6.7 oz (64.6 kg) IBW/kg (Calculated) : 48.76 Adjusted Body Weight:   Vital Signs: Temp: 98.9 F (37.2 C) (09/05 1515) BP: 128/66 mmHg (09/05 1745) Pulse Rate: 85 (09/05 1515) Intake/Output from previous day: 09/04 0701 - 09/05 0700 In: 2927.7 [I.V.:1387.7; NG/GT:1440; IV Piggyback:100] Out: 1205 [Urine:505; Stool:700] Intake/Output from this shift: Total I/O In: 1369.4 [I.V.:469.4; NG/GT:700; IV Piggyback:200] Out: 940 [Urine:440; Stool:500]  Labs:  Recent Labs  06/15/15 0355 06/15/15 1833 06/16/15 0510 06/17/15 0143  WBC 17.4*  --  26.2* 24.9*  HGB 7.7*  --  8.1* 7.3*  PLT 206  --  251 240  CREATININE 0.77 0.82 0.83 1.10*   Estimated Creatinine Clearance: 32.5 mL/min (by C-G formula based on Cr of 1.1). No results for input(s): VANCOTROUGH, VANCOPEAK, VANCORANDOM, GENTTROUGH, GENTPEAK, GENTRANDOM, TOBRATROUGH, TOBRAPEAK, TOBRARND, AMIKACINPEAK, AMIKACINTROU, AMIKACIN in the last 72 hours.    Medical History: Past Medical History  Diagnosis Date  . Diabetes mellitus   . Hypertension   . Hyperlipidemia   . Thyroid disease   . Dementia     Medications:  Prescriptions prior to admission  Medication Sig Dispense Refill Last Dose  . amLODipine (NORVASC) 5 MG tablet Take 5 mg by mouth daily.   06/04/2015 at Unknown time  . aspirin 81 MG chewable tablet Chew 81 mg by mouth daily.   06/04/2015 at Unknown time  . atorvastatin (LIPITOR) 10 MG tablet Take 10 mg by mouth daily at 6 PM.      . atorvastatin (LIPITOR) 10 MG tablet Take 10 mg by mouth daily.   06/04/2015 at Unknown time  . cetirizine (ZYRTEC) 10 MG tablet Take 1 tablet (10 mg total) by mouth daily as needed. (Patient taking differently: Take 10 mg by mouth daily. ) 30 tablet 6 06/04/2015 at Unknown time  . cholecalciferol (VITAMIN  D) 1000 UNITS tablet Take 1,000 Units by mouth daily.   06/04/2015 at Unknown time  . glimepiride (AMARYL) 1 MG tablet Take 1 mg by mouth every morning.   06/03/2015 at Unknown time  . hydrochlorothiazide (HYDRODIURIL) 25 MG tablet Take 1 tablet (25 mg total) by mouth daily. 30 tablet 6 06/04/2015 at Unknown time  . lamoTRIgine (LAMICTAL) 100 MG tablet Take 100 mg by mouth daily.   06/03/2015  . lisinopril (PRINIVIL,ZESTRIL) 40 MG tablet Take 1 tablet (40 mg total) by mouth daily. 30 tablet 6 06/04/2015 at Unknown time  . memantine (NAMENDA) 10 MG tablet Take 10 mg by mouth 2 (two) times daily.   06/04/2015 at Unknown time  . metFORMIN (GLUCOPHAGE) 500 MG tablet Take 500 mg by mouth daily.   06/07/2015 at Unknown time  . metoprolol succinate (TOPROL XL) 25 MG 24 hr tablet Take 25 mg by mouth daily.     . mirtazapine (REMERON) 15 MG tablet Take 15 mg by mouth at bedtime.   06/04/2015 at Unknown time  . polyethylene glycol (MIRALAX / GLYCOLAX) packet Take 17 g by mouth daily as needed. (Patient taking differently: Take 17 g by mouth daily as needed for mild constipation. ) 30 each 6 Past Week at Unknown time   Assessment: 81 yo female s/p 10 days post-op ex lap, on day #6 PO vanc/flagyl for cdiff, now with GNR and GPC in blood cultures.   C diff toxin 8/29>>negative C diff antigen 8/29>>positive  9/5 vanc >>  9/5 zosyn >> 8/30 PO vanc >> 8/30 >> Flagyl >> 8/25 zoysn >>8/25 8/25 zinacef >>8/26  Goal of Therapy:  Vancomycin trough level 15-20 mcg/ml  Plan:  -Vancomycin 1250 mg IV x1 then 1g/24h -Zosyn 3.375 g IV q8h -Monitor cultures, susceptibilities, renal fx, VT at Css    Agapito Games, PharmD, BCPS Clinical Pharmacist Pager: [phone] 06/17/2015 6:44 PM

## 2015-06-17 NOTE — Progress Notes (Signed)
PULMONARY / CRITICAL CARE MEDICINE   Name: Amber Hood MRN: [id number] DOB: 1928/08/19    ADMISSION DATE:  06/05/2015 CONSULTATION DATE: 8/25  REFERRING MD :  Hart Rochester  CHIEF COMPLAINT:  Abd pain  INITIAL PRESENTATION: To [facility] with abd pain  HISTORY OF PRESENT ILLNESS:   81 yo AAM with history of dementia/falls, Afib but not on anticoagulation due to falls, who presented to [facility] 8/24 with acute abd pain. She was transferred to [facility] and to Care of Vascular surgery for exp lap with superior mesenteric thrombectomy. She returned from OR to SICU and PCCM asked to assist in post op management.  STUDIES:  TTE 8/28 - EF 65-70%. No regional wall motion abnormalities. Couldn't assess diastolic. Mild aortic stenosis. Mild mitral stenosis. RV normal in size and function. CT abd/pelvis 9/4: 1. 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 The vessel is generally widely patent, with exception of a short segment which appears either partially or completely occluded. There is distal reconstitution of flow, which could be from incomplete occlusion, or from collateral flow.2. Mucosal enhancement throughout the small bowel and colon, without definite areas suspicious for ischemia on today's examination. There are some mildly thickened and hyperenhancing loops of proximal ileum, which may be a response to recent revascularization.3. Large hematoma deep to the skin sutures in the subcutaneous fat of the anterior abdominal wall in the midline.4. High-grade stenosis at the ostium of the right renal artery.Clinical correlation for signs and symptoms of renovascular hypertension is recommended.5. Small volume of ascites.6. Small bilateral pleural effusions lying dependently with postoperative subsegmental atelectasis in the lower lobes of the lungs bilaterally.  SIGNIFICANT  EVENTS: 8/25 - Admitted to [facility] 8/25 - S/P Ex Lap & thrombectomy for mesenteric thrombus 8/31 - 1u PRBC for abdominal wall hematoma 9/2 failed sbt, lasix  9/3 cuff blown. Re-intubated. Lasix increased. Decreased fentanyl, stopped ativan and added PRN haldol. Did OK w/ haldol.  9/4 very agitated. Failed SBT. RR 30s. Fluid balance finally >2 liters. Spiking fevers. Sputum sent. CT abd/pelvis ordered. Hypotensive after haldol. Hold lasix today. Neo gtt. precedex in order to avoid swings in agitation and sedation. Blood and urine sent later that evening when temp spiked >103.  9/5 calm on precedex. BP holding but requiring neo to do so w/ precedex on board. SBT initiated. F/VT boarderline at just under 100. Resuming lasix for positive balance   SUBJECTIVE:  Calm on precedex.   VITAL SIGNS: Temp:  [98 F (36.7 C)-103.5 F (39.7 C)] 98 F (36.7 C) (09/05 0400) Pulse Rate:  [64-144] 85 (09/05 0719) Resp:  [8-35] 17 (09/05 0719) BP: (64-179)/(24-117) 151/72 mmHg (09/05 0719) SpO2:  [75 %-100 %] 100 % (09/05 0719) FiO2 (%):  [40 %] 40 % (09/05 0719) Weight:  [64.6 kg (142 lb 6.7 oz)] 64.6 kg (142 lb 6.7 oz) (09/05 0430) HEMODYNAMICS:   VENTILATOR SETTINGS: Vent Mode:  [-] PRVC FiO2 (%):  [40 %] 40 % Set Rate:  [12 bmp] 12 bmp Vt Set:  [400 mL] 400 mL PEEP:  [5 cmH20] 5 cmH20 Plateau Pressure:  [16 cmH20-23 cmH20] 23 cmH20 INTAKE / OUTPUT:  Intake/Output Summary (Last 24 hours) at 06/17/15 0758 Last data filed at 06/17/15 0700  Gross per 24 hour  Intake 2927.72 ml  Output   1205 ml  Net 1722.72 ml    PHYSICAL EXAMINATION: General:  Elderly female. Calm on  precedex.  Neuro: Spontaneously moving all 4 extremities. Easily arouses. Calm. Follows some commands Cardiovascular:  A. fib on tele. Normal rate. No edema. -->HR increased w/ weaning efforts Lungs:  Symmetric chest wall rise on ventilator. Scattered rhonchi  Abdomen: Abdominal incision with staples intact. Normoactive  bowel sounds. Hematoma palpated to the left of abdominal incision less firm today. Musculoskeletal: No joint deformity or effusion appreciated. Skin: Warm and dry. No rash on exposed skin. Abdominal incision in tact. Mild bruising around incision.  LABS:  CBC  Recent Labs Lab 06/15/15 0355 06/16/15 0510 06/17/15 0143  WBC 17.4* 26.2* 24.9*  HGB 7.7* 8.1* 7.3*  HCT 22.9* 24.3* 22.3*  PLT 206 251 240   Coag's No results for input(s): APTT, INR in the last 168 hours. BMET  Recent Labs Lab 06/15/15 1833 06/16/15 0510 06/17/15 0143  NA 141 139 140  K 4.7 4.8 4.4  CL 109 106 108  CO2 24 27 24   BUN 12 14 23*  CREATININE 0.82 0.83 1.10*  GLUCOSE 142* 164* 183*   Electrolytes  Recent Labs Lab 06/15/15 0355 06/15/15 1833 06/16/15 0510 06/17/15 0143  CALCIUM 8.3* 8.4* 8.2* 8.6*  MG 1.8  --  1.6* 2.1  PHOS 2.4*  --  2.8 2.0*   Sepsis Markers  Recent Labs Lab 06/11/15 0935 06/12/15 0423 06/13/15 0501  PROCALCITON 0.40 0.52 0.51   ABG No results for input(s): PHART, PCO2ART, PO2ART in the last 168 hours. Liver Enzymes  Recent Labs Lab 06/15/15 0355 06/16/15 0510 06/17/15 0143  ALBUMIN 1.5* 1.5* 1.5*   Cardiac Enzymes No results for input(s): TROPONINI, PROBNP in the last 168 hours. Glucose  Recent Labs Lab 06/16/15 0005 06/16/15 0401 06/16/15 0957 06/16/15 1216 06/16/15 1723 06/16/15 1936  GLUCAP 92 112* 200* 209* 247* 215*    Imaging Dg Chest Port 1 View  06/16/2015   CLINICAL DATA:  80 year old female with history of pneumonia.  EXAM: PORTABLE CHEST - 1 VIEW  COMPARISON:  Chest x-ray 06/15/2015.  FINDINGS: There is a right-sided internal jugular central venous catheter with tip terminating in the proximal superior vena cava. An endotracheal tube is in place with tip 4.8 cm above the carina. A nasogastric tube is seen extending into the stomach, however, the tip of the nasogastric tube extends below the lower margin of the image. Lung volumes are  normal. Patchy ill-defined opacities throughout the mid to lower lungs bilaterally, slightly improved compared to yesterday's examination. Small bilateral pleural effusions appear slightly decreased compared to yesterday's examination. Pulmonary vasculature does not appear engorged. Heart size is borderline enlarged. Upper mediastinal contours are distorted by patient positioning.  IMPRESSION: 1. Support apparatus, as above. 2. Slightly improved aeration, with persistent bibasilar areas of atelectasis and/or consolidation, but decreasing small bilateral pleural effusions.   Electronically Signed   By: Trudie Reed M.D.   On: 06/16/2015 15:38   Ct Angio Abd/pel W/ And/or W/o  06/16/2015   CLINICAL DATA:  81 year old female with abdominal pain. History of thrombosed superior mesenteric artery status post embolectomy on 05/14/2015. Evaluate for mesenteric ischemia.  EXAM: CTA ABDOMEN AND PELVIS WITHOUT AND WITH CONTRAST  TECHNIQUE: Multidetector CT imaging of the abdomen and pelvis was performed using the standard protocol during bolus administration of intravenous contrast. Multiplanar reconstructed images and MIPs were obtained and reviewed to evaluate the vascular anatomy.  CONTRAST:  OMNIPAQUE IOHEXOL 350 MG/ML SOLN  COMPARISON:  CT the abdomen and pelvis 06/05/2015.  FINDINGS: Lower chest: Small bilateral pleural effusions lying dependently  with associated passive atelectasis in the dependent portions of the lung bases bilaterally. Cardiomegaly. Atherosclerotic calcifications in the left anterior descending, left circumflex and right coronary arteries. Calcifications of the aortic valve, mitral valve and mitral annulus.  Hepatobiliary: No cystic or solid hepatic lesions. No intra or extrahepatic biliary ductal dilatation. Status post cholecystectomy.  Pancreas: Well-defined low-attenuation lesion measuring 1.5 cm in the tail of the pancreas (image 41 of series 501) unchanged. No other pancreatic mass.  No pancreatic ductal dilatation. No peripancreatic inflammatory changes.  Spleen: Unremarkable.  Adrenals/Urinary Tract: Bilateral adrenal glands are normal in appearance. Multiple nonobstructive calculi within the left renal collecting system, largest of which measures 7 mm in the interpolar region. Sub cm low-attenuation lesions in the kidneys bilaterally, too small to definitively characterize, but statistically likely small cysts. No hydroureteronephrosis. Urinary bladder is nearly completely decompressed, with a Foley balloon catheter in place. Small amount of gas non dependently in the urinary bladder, presumably iatrogenic.  Stomach/Bowel: The appearance of the stomach is normal. No pathologic dilatation of small bowel or colon. Rectal bag in place. The mucosa of the small bowel and colon appears to enhance, without definite nonenhancing regions to suggest frank areas of ischemia. There do appear to be some mildly thickened loops of proximal ileum, which demonstrate increased enhancement, which could be a response to recently re-established blood flow.  Vascular/Lymphatic: Compared to the prior examination, blood flow has been re-established within the previously thrombosed superior mesenteric artery. 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 There is a focal intimal abnormality on image 54 of series 401, which could represent residual thrombus or postsurgical change from recent embolectomy. Images 84 and 85 of series 401 demonstrate lack of blood flow in a short segment of the superior mesenteric artery, however, blood flow appears reconstituted distally to this at the transition from images 90-91. This is indicative of either incomplete obstruction, or perfusion from collateral vasculature. Celiac axis and inferior mesenteric artery are both patent. High-grade stenosis at the ostium of the right renal artery. Superior mesenteric vein and  portal vein are widely patent. Circumaortic left renal vein (normal anatomical variant) incidentally noted. No lymphadenopathy noted in the abdomen or pelvis.  Reproductive: Status post hysterectomy. Ovaries are unremarkable in appearance.  Other: Large amount of high attenuation fluid immediately deep to the midline surgical wound, compatible with a large postoperative hematoma. This is in the subcutaneous fat of the anterior abdominal wall and measures up to 3.3 x 3.0 cm on axial images, spanning a total length of approximately 21.6 cm in the midline. Small volume of ascites. No pneumoperitoneum.  Musculoskeletal: There are no aggressive appearing lytic or blastic lesions noted in the visualized portions of the skeleton.  Review of the MIP images confirms the above findings.  IMPRESSION: 1. Status post SMA embolectomy, with restoration of flow in the superior mesenteric artery, as above. 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 The vessel is generally widely patent, with exception of a short segment which appears either partially or completely occluded. There is distal reconstitution of flow, which could be from incomplete occlusion, or from collateral flow. 2. Mucosal enhancement throughout the small bowel and colon, without definite areas suspicious for ischemia on today's examination. There are some mildly thickened and hyperenhancing loops of proximal ileum, which may be a response  to recent revascularization. 3. Large hematoma deep to the skin sutures in the subcutaneous fat of the anterior abdominal wall in the midline. 4. High-grade stenosis at the ostium of the right renal artery. Clinical correlation for signs and symptoms of renovascular hypertension is recommended. 5. Small volume of ascites. 6. Small bilateral pleural effusions lying dependently with postoperative  subsegmental atelectasis in the lower lobes of the lungs bilaterally. 7. Additional incidental findings, as above.   Electronically Signed   By: Trudie Reed M.D.   On: 06/16/2015 17:43  bibasilar atx and effusions via CXR/ confirmed from CT abd lower lung fields.    ASSESSMENT / PLAN:  PULMONARY OETT 8/25>> A: VDRF S/P Ex Lap Mucus Plugging - resolved Bibasilar atx and small effusions She has boarderline but acceptable Ve for her TBW. Her mental status is the calmest that it has been the entire admit on precedex.   Plan Vent bundle SBT daily-->will cont to observe her over this am; will call daughter. Might be worth a trial of extubation, but would prefer to not re-intubate so need to further discuss goals of care and expectations.  Xopenex neb q6hr for mucociliary clearance Chest PT via bed qid cont precedex even thru extubation   CARDIOVASCULAR CVL 8/25 rt i j >> A:  Hypotension - Resolved. Secondary to blood loss and sedation on 9/4 A Fib w/ RVR - deemed not a candidate for anticoagulation due to falls but on heparin per surgery H/O HTN Mild AS & MS on TTE  P:  Cardiology following Digoxin IV daily Lopressor IV every 4 hours Avoiding amiodarone given potential for cardioversion Off Heparin gtt due to abdominal wall hematoma Low dose neo to allow for agitation rx Lasix x 1 Get PICC line   RENAL A:  Intermittent Hypomagnesemia Intermittent Hypokalemia  Intermittent Hypophosphatemia P:   Replace lytes as needed Strict I&O Trend daily BUN/creatinine along with urine output  GASTROINTESTINAL A:  S/P Ex Lap POD #10 Superior Mesenteric Artery Thrombus C diff Colonization vs Infection w/ Diarrhea Abdominal Wall Hematoma  P:   Protonix to VT pepcid Tube feeds per dietary See ID Section   HEMATOLOGIC A:   Leukocytosis - Likely reactive Anemia - due to abdominal wall hematoma  P:  Holding heparin gtt due to hematoma Monitoring daily hemoglobin with  CBC Threshold for transfusion PRBC Hgb <8.0 or hypotension w/ blood loss SCDs for DVT prophylaxis  INFECTIOUS A:   C diff Colonization vs Infection Fever spike and rising leukocytosis 9/4, CT abd w/ as expected changes.. Did have large hematoma noted. Wonder about likelihood of this being a potential source of infection as well. She has been pan-cultured. Will hold off on empiric HCAP coverage until we see any results as we are still treating for Cdiff.  P:   Monitor for fever & plan for panculture for temp greater than 38.0 C Trending leukocytosis Abx:  Vanco PO 8/30>> Flagyl 8/30>> 8/25 zoysn>>8/25 8/25 zinacef>>8/26 Repeat sputum 9/4>>> BC 9/4>>> UC 9/4>>>  ENDOCRINE A:   H/O DM Hypoglycemia to 68 on 9/2  P:   Sliding-scale insulin per algorithm Accu-Checks every 4 hours  NEUROLOGIC A:   H/O Dementia-->superimposed acute encephalopathy  Postoperative abdominal pain  P:   RASS goal: 0 to -1 Cont precedex  FAMILY  - Updates: Family meeting held 9/1 with Dr. Hart Rochester present. Family updated regarding her current clinical state and failure to wean from ventilator.  - Inter-disciplinary family meet or Palliative Care meeting:  9/1 w/  Dr. Lysbeth Galas, Benita, & other family members.   TODAY'S SUMMARY: 81 yo AAM with history of dementia/falls, Afib but not on anticoagulation due to falls, who presented to [facility] 8/24 with acute abd pain. S/P thrombectomy & Ex Lap. Patient developed abdominal wall hematoma & now off heparin gtts. Agitation and then oversedation has been her big barrier. She spiked fever yesterday and has been pan-cultured. CT abd/pelvis w/out obvious source but I wonder if nothing comes up from pending cultures the risk of the hematoma being infected. Her agitation is the most controlled I've seen in the last three days since adding precedex. Her F/Vt is boarderline, but if she can hold this would suggest she can possibly tolerate extubation. Will await  cultures and hold off from adding empiric abx for now. Add lasix X 1 as I would prefer a negative fluid balance and watch her over the am on PSV. WIll contact her daughters. She is weak. May not get much better any time soon. Would like to establish some limitations. If she were to fail extubation trach would be next and don't think that is a good choice for her.   Simonne Martinet ACNP-BC [facility] Pulmonary/Critical Care Pager # [phone] OR # [phone] if no answer  Attending Note  81 year old female with history of dementia presenting with acute abdominal pain found to have a mesenteric thrombus, s/p ex lap and thrombectomy complicated by an abdominal wall hematoma. She remains in positive fluid balance and unresponsive, not following commands but not sure if ever will at this point. Lighten sedation. Precedex added. Pressors off, will begin diurese gently given renal function. Will monitor for now.  Hold off further weaning from vent today.  The patient is critically ill with multiple organ systems failure and requires high complexity decision making for assessment and support, frequent evaluation and titration of therapies, application of advanced monitoring technologies and extensive interpretation of multiple databases.   Critical Care Time devoted to patient care services described in this note is 35 Minutes. This time reflects time of care of this signee Dr [name]. This critical care time does not reflect procedure time, or teaching time or supervisory time of PA/NP/Med student/Med Resident etc but could involve care discussion time.  Alyson Reedy, M.D. [facility] Pulmonary/Critical Care Medicine. Pager: [phone]. After hours pager: [phone].

## 2015-06-17 NOTE — Progress Notes (Signed)
Good d/w both daughters. They are both very realistic and do not want MRs [place] to suffer. They do not want trach. They understand that her post-op course has not gone well and that her prognosis for a full recovery is poor at best. They have 6 brothers.   Plan at this point is as follows 1) continue to maximize fluid balance 2) rest today 3) will plan for extubation in next 24-48 hrs. Once extubated she will be DNR/DNI (this will need to be confirmed once more prior to extubation) 4) after extubation if doing well continue full medical care, however if she begins to struggle family is prepared to transition to comfort and palliative focused care.   Simonne Martinet ACNP-BC [facility] Pulmonary/Critical Care Pager # [phone] OR # [phone] if no answer

## 2015-06-17 NOTE — Progress Notes (Signed)
eLink Physician-Brief Progress Note Patient Name: Amber Hood DOB: 17-Aug-1928 MRN: [id number]   Date of Service  06/17/2015  HPI/Events of Note  Blood and urine cx sent for T 103F  eICU Interventions       Intervention Category Intermediate Interventions: Other:  Draxton Luu S. 06/17/2015, 1:18 AM

## 2015-06-18 LAB — GLUCOSE, CAPILLARY
GLUCOSE-CAPILLARY: 123 mg/dL — AB (ref 65–99)
GLUCOSE-CAPILLARY: 156 mg/dL — AB (ref 65–99)
GLUCOSE-CAPILLARY: 156 mg/dL — AB (ref 65–99)
GLUCOSE-CAPILLARY: 168 mg/dL — AB (ref 65–99)
GLUCOSE-CAPILLARY: 190 mg/dL — AB (ref 65–99)
GLUCOSE-CAPILLARY: 212 mg/dL — AB (ref 65–99)
Glucose-Capillary: 170 mg/dL — ABNORMAL HIGH (ref 65–99)
Glucose-Capillary: 161 mg/dL — ABNORMAL HIGH (ref 65–99)

## 2015-06-18 LAB — CBC WITH DIFFERENTIAL/PLATELET
BASOS ABS: 0 10*3/uL (ref 0.0–0.1)
Basophils Relative: 0 % (ref 0–1)
Eosinophils Absolute: 0 10*3/uL (ref 0.0–0.7)
Eosinophils Relative: 0 % (ref 0–5)
HCT: 25.6 % — ABNORMAL LOW (ref 36.0–46.0)
HEMOGLOBIN: 8.2 g/dL — AB (ref 12.0–15.0)
LYMPHS PCT: 3 % — AB (ref 12–46)
Lymphs Abs: 0.7 10*3/uL (ref 0.7–4.0)
MCH: 28.9 pg (ref 26.0–34.0)
MCHC: 32 g/dL (ref 30.0–36.0)
MCV: 90.1 fL (ref 78.0–100.0)
MONOS PCT: 0 % — AB (ref 3–12)
Monocytes Absolute: 0 10*3/uL — ABNORMAL LOW (ref 0.1–1.0)
NEUTROS ABS: 24.2 10*3/uL — AB (ref 1.7–7.7)
Neutrophils Relative %: 97 % — ABNORMAL HIGH (ref 43–77)
Platelets: 236 10*3/uL (ref 150–400)
RBC: 2.84 MIL/uL — ABNORMAL LOW (ref 3.87–5.11)
RDW: 14.7 % (ref 11.5–15.5)
WBC MORPHOLOGY: INCREASED
WBC: 24.9 10*3/uL — ABNORMAL HIGH (ref 4.0–10.5)

## 2015-06-18 LAB — COMPREHENSIVE METABOLIC PANEL
ALBUMIN: 1.5 g/dL — AB (ref 3.5–5.0)
ALK PHOS: 74 U/L (ref 38–126)
ALT: 17 U/L (ref 14–54)
AST: 40 U/L (ref 15–41)
Anion gap: 12 (ref 5–15)
BUN: 39 mg/dL — AB (ref 6–20)
CALCIUM: 8.5 mg/dL — AB (ref 8.9–10.3)
CO2: 20 mmol/L — AB (ref 22–32)
CREATININE: 1.34 mg/dL — AB (ref 0.44–1.00)
Chloride: 109 mmol/L (ref 101–111)
GFR calc non Af Amer: 35 mL/min — ABNORMAL LOW (ref >60)
GFR, EST AFRICAN AMERICAN: 41 mL/min — AB (ref >60)
GLUCOSE: 215 mg/dL — AB (ref 65–99)
Potassium: 4.2 mmol/L (ref 3.5–5.1)
SODIUM: 141 mmol/L (ref 135–145)
Total Bilirubin: 1.1 mg/dL (ref 0.3–1.2)
Total Protein: 5 g/dL — ABNORMAL LOW (ref 6.5–8.1)

## 2015-06-18 LAB — PROCALCITONIN: Procalcitonin: 10.23 ng/mL

## 2015-06-18 LAB — MAGNESIUM: MAGNESIUM: 2 mg/dL (ref 1.7–2.4)

## 2015-06-18 NOTE — Progress Notes (Signed)
   VASCULAR SURGERY ASSESSMENT & PLAN:  * 12 Days Post-Op s/p: SMA embolectomy  * Nutrition: Tolerating tube feeds at goal rate  * Pulmonary: Still on vent. Family does not want trach. Plan is for extubation in 24-48 hours then DNR/DNI.  * Renal function normal.   * Cardiac: AFib- on dig via feeding tube. Cardiology following. Getting lasix for acute diastolic CHF.  * Leukocytosis: No significant change. CT of abdomen: SMA widely patent. No obvious areas of ischemia.   * Being treated for C. Diff (IV flagyl and Vanco - FT)  SUBJECTIVE: Sedated on vent  PHYSICAL EXAM: Filed Vitals:   06/18/15 0500 06/18/15 0600 06/18/15 0700 06/18/15 0733  BP: 119/58 123/63 110/56 100/69  Pulse:    112  Temp:      TempSrc:      Resp: Height:      Weight: 144 lb 6.4 oz (65.5 kg)     SpO2: 100% 100% 100% 100%   Abdomen: hypoactive BS Lungs: good air exchange  LABS: Lab Results  Component Value Date   WBC 24.9* 06/18/2015   HGB 8.2* 06/18/2015   HCT 25.6* 06/18/2015   MCV 90.1 06/18/2015   PLT 236 06/18/2015   Lab Results  Component Value Date   CREATININE 1.34* 06/18/2015   Lab Results  Component Value Date   INR 1.37 06/24/2015   CBG (last 3)   Recent Labs  06/17/15 2001 06/17/15 2351 06/18/15 0415  GLUCAP 136* 156* 170*    Active Problems:   Acute mesenteric ischemia   Shock circulatory   Atrial fibrillation with RVR   Ventilator dependence   Respiratory failure   Respiratory failure requiring intubation   Pneumonia   Hypoxia    Cari Caraway Beeper: [phone] 06/18/2015

## 2015-06-18 NOTE — Progress Notes (Signed)
PULMONARY / CRITICAL CARE MEDICINE   Name: Amber Hood MRN: [id number] DOB: [birth year]    ADMISSION DATE:  06/25/2015 CONSULTATION DATE: 8/25  REFERRING MD :  Hart Rochester  CHIEF COMPLAINT:  Abd pain  INITIAL PRESENTATION: To [facility] with abd pain  HISTORY OF PRESENT ILLNESS:   81 yo AAM with history of dementia/falls, Afib but not on anticoagulation due to falls, who presented to [facility] 8/24 with acute abd pain. She was transferred to [facility] and to Care of Vascular surgery for exp lap with superior mesenteric thrombectomy. She returned from OR to SICU and PCCM asked to assist in post op management.  STUDIES:  TTE 8/28 - EF 65-70%. No regional wall motion abnormalities. Couldn't assess diastolic. Mild aortic stenosis. Mild mitral stenosis. RV normal in size and function. CT abd/pelvis 9/4: 1. 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 The vessel is generally widely patent, with exception of a short segment which appears either partially or completely occluded. There is distal reconstitution of flow, which could be from incomplete occlusion, or from collateral flow.2. Mucosal enhancement throughout the small bowel and colon, without definite areas suspicious for ischemia on today's examination. There are some mildly thickened and hyperenhancing loops of proximal ileum, which may be a response to recent revascularization.3. Large hematoma deep to the skin sutures in the subcutaneous fat of the anterior abdominal wall in the midline.4. High-grade stenosis at the ostium of the right renal artery.Clinical correlation for signs and symptoms of renovascular hypertension is recommended.5. Small volume of ascites.6. Small bilateral pleural effusions lying dependently with postoperative subsegmental atelectasis in the lower lobes of the lungs bilaterally.  SIGNIFICANT  EVENTS: 8/25 - Admitted to [facility] 8/25 - S/P Ex Lap & thrombectomy for mesenteric thrombus 8/31 - 1u PRBC for abdominal wall hematoma 9/2 failed sbt, lasix  9/3 cuff blown. Re-intubated. Lasix increased. Decreased fentanyl, stopped ativan and added PRN haldol. Did OK w/ haldol.  9/4 very agitated. Failed SBT. RR 30s. Fluid balance finally >2 liters. Spiking fevers. Sputum sent. CT abd/pelvis ordered. Hypotensive after haldol. Hold lasix today. Neo gtt. precedex in order to avoid swings in agitation and sedation. Blood and urine sent later that evening when temp spiked >103.  9/5 calm on precedex. BP holding but requiring neo to do so w/ precedex on board. SBT initiated. F/VT boarderline at just under 100. Resuming lasix for positive balance  06/17/15 calm on precedex.    SUBJECTIVE/OVERNIGHT/INTERVAL HX  06/18/2015 : on precedex gtt. Reports are that she gets agitated on WUA. Goals of care discussion yesterday - likely one way extubation by 07/03/2015. DNR/DNI/palliation if she does not do well with extubation. No trach. Family not at bedside. PAtient on precedex and neo byut also getting lopressor and dig. Having loose stools - also on colace and dulcolax   VITAL SIGNS: Temp:  [98.9 F (37.2 C)-102.1 F (38.9 C)] 100 F (37.8 C) (09/06 0419) Pulse Rate:  [79-156] 112 (09/06 0733) Resp:  [18-39] 29 (09/06 0733) BP: (74-165)/(39-144) 100/69 mmHg (09/06 0733) SpO2:  [91 %-100 %] 100 % (09/06 0733) FiO2 (%):  [40 %] 40 % (09/06 0733) Weight:  [65.5 kg (144 lb 6.4 oz)] 65.5 kg (144 lb 6.4 oz) (09/06 0500) HEMODYNAMICS:   VENTILATOR SETTINGS: Vent Mode:  [-] PSV;CPAP FiO2 (%):  [40 %] 40 % Set Rate:  [12 bmp] 12 bmp Vt Set:  [400 mL]  400 mL PEEP:  [5 cmH20] 5 cmH20 Pressure Support:  [5 cmH20-10 cmH20] 5 cmH20 Plateau Pressure:  [20 cmH20-22 cmH20] 21 cmH20 INTAKE / OUTPUT:  Intake/Output Summary (Last 24 hours) at 06/18/15 0809 Last data filed at 06/18/15 0600  Gross per 24 hour   Intake 2646.4 ml  Output   2095 ml  Net  551.4 ml    PHYSICAL EXAMINATION: General:  Elderly female. Calm on precedex.  Very deconditioned Neuro: RASS -3 on prcedex Cardiovascular:  A. fib on tele. Normal rate. No edema. -->HR increased w/ weaning efforts Lungs:  Symmetric chest wall rise on ventilator. Scattered rhonchi  Abdomen: Abdominal incision with staples intact. Normoactive bowel sounds. Hematoma palpated to the left of abdominal incision less firm today. Musculoskeletal: No joint deformity or effusion appreciated. ANASARCA + Skin: Warm and dry. No rash on exposed skin. Abdominal incision in tact. Mild bruising around incision.  LABS:  PULMONARY No results for input(s): PHART, PCO2ART, PO2ART, HCO3, TCO2, O2SAT in the last 168 hours.  Invalid input(s): PCO2, PO2  CBC  Recent Labs Lab 06/16/15 0510 06/17/15 0143 06/18/15 0545  HGB 8.1* 7.3* 8.2*  HCT 24.3* 22.3* 25.6*  WBC 26.2* 24.9* 24.9*  PLT 251 240 236    COAGULATION No results for input(s): INR in the last 168 hours.  CARDIAC  No results for input(s): TROPONINI in the last 168 hours. No results for input(s): PROBNP in the last 168 hours.   CHEMISTRY  Recent Labs Lab 06/13/15 0501 06/14/15 0500 06/14/15 1411 06/15/15 0355 06/15/15 1833 06/16/15 0510 06/17/15 0143 06/18/15 0545  NA 136 140 135 139 141 139 140 141  K 3.8 2.7* 3.3* 3.8 4.7 4.8 4.4 4.2  CL 109 109 107 109 109 106 108 109  CO2 22 26 25 26 24 27 24  20*  GLUCOSE 168* 106* 401* 196* 142* 164* 183* 215*  BUN 6 7 6 10 12 14  23* 39*  CREATININE 0.83 0.81 0.79 0.77 0.82 0.83 1.10* 1.34*  CALCIUM 8.4* 8.6* 7.6* 8.3* 8.4* 8.2* 8.6* 8.5*  MG 1.6* 1.6* 1.9 1.8  --  1.6* 2.1 2.0  PHOS 2.2* 2.6  --  2.4*  --  2.8 2.0*  --    Estimated Creatinine Clearance: 26.9 mL/min (by C-G formula based on Cr of 1.34).   LIVER  Recent Labs Lab 06/14/15 0500 06/15/15 0355 06/16/15 0510 06/17/15 0143 06/18/15 0545  AST  --   --   --   --  40   ALT  --   --   --   --  17  ALKPHOS  --   --   --   --  74  BILITOT  --   --   --   --  1.1  PROT  --   --   --   --  5.0*  ALBUMIN 1.7* 1.5* 1.5* 1.5* 1.5*     INFECTIOUS  Recent Labs Lab 06/11/15 0935 06/12/15 0423 06/13/15 0501  PROCALCITON 0.40 0.52 0.51     ENDOCRINE CBG (last 3)   Recent Labs  06/17/15 2001 06/17/15 2351 06/18/15 0415  GLUCAP 136* 156* 170*         IMAGING x48h  - image(s) personally visualized  -   highlighted in bold Dg Chest Port 1 View  06/16/2015   CLINICAL DATA:  81 year old female with history of pneumonia.  EXAM: PORTABLE CHEST - 1 VIEW  COMPARISON:  Chest x-ray 06/15/2015.  FINDINGS: There is a right-sided internal jugular central  venous catheter with tip terminating in the proximal superior vena cava. An endotracheal tube is in place with tip 4.8 cm above the carina. A nasogastric tube is seen extending into the stomach, however, the tip of the nasogastric tube extends below the lower margin of the image. Lung volumes are normal. Patchy ill-defined opacities throughout the mid to lower lungs bilaterally, slightly improved compared to yesterday's examination. Small bilateral pleural effusions appear slightly decreased compared to yesterday's examination. Pulmonary vasculature does not appear engorged. Heart size is borderline enlarged. Upper mediastinal contours are distorted by patient positioning.  IMPRESSION: 1. Support apparatus, as above. 2. Slightly improved aeration, with persistent bibasilar areas of atelectasis and/or consolidation, but decreasing small bilateral pleural effusions.   Electronically Signed   By: Trudie Reed M.D.   On: 06/16/2015 15:38   Ct Angio Abd/pel W/ And/or W/o  06/16/2015   CLINICAL DATA:  81 year old female with abdominal pain. History of thrombosed superior mesenteric artery status post embolectomy on 06/10/2015. Evaluate for mesenteric ischemia.  EXAM: CTA ABDOMEN AND PELVIS WITHOUT AND WITH CONTRAST   TECHNIQUE: Multidetector CT imaging of the abdomen and pelvis was performed using the standard protocol during bolus administration of intravenous contrast. Multiplanar reconstructed images and MIPs were obtained and reviewed to evaluate the vascular anatomy.  CONTRAST:  OMNIPAQUE IOHEXOL 350 MG/ML SOLN  COMPARISON:  CT the abdomen and pelvis 06/05/2015.  FINDINGS: Lower chest: Small bilateral pleural effusions lying dependently with associated passive atelectasis in the dependent portions of the lung bases bilaterally. Cardiomegaly. Atherosclerotic calcifications in the left anterior descending, left circumflex and right coronary arteries. Calcifications of the aortic valve, mitral valve and mitral annulus.  Hepatobiliary: No cystic or solid hepatic lesions. No intra or extrahepatic biliary ductal dilatation. Status post cholecystectomy.  Pancreas: Well-defined low-attenuation lesion measuring 1.5 cm in the tail of the pancreas (image 41 of series 501) unchanged. No other pancreatic mass. No pancreatic ductal dilatation. No peripancreatic inflammatory changes.  Spleen: Unremarkable.  Adrenals/Urinary Tract: Bilateral adrenal glands are normal in appearance. Multiple nonobstructive calculi within the left renal collecting system, largest of which measures 7 mm in the interpolar region. Sub cm low-attenuation lesions in the kidneys bilaterally, too small to definitively characterize, but statistically likely small cysts. No hydroureteronephrosis. Urinary bladder is nearly completely decompressed, with a Foley balloon catheter in place. Small amount of gas non dependently in the urinary bladder, presumably iatrogenic.  Stomach/Bowel: The appearance of the stomach is normal. No pathologic dilatation of small bowel or colon. Rectal bag in place. The mucosa of the small bowel and colon appears to enhance, without definite nonenhancing regions to suggest frank areas of ischemia. There do appear to be some mildly  thickened loops of proximal ileum, which demonstrate increased enhancement, which could be a response to recently re-established blood flow.  Vascular/Lymphatic: Compared to the prior examination, blood flow has been re-established within the previously thrombosed superior mesenteric artery. 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 There is a focal intimal abnormality on image 54 of series 401, which could represent residual thrombus or postsurgical change from recent embolectomy. Images 84 and 85 of series 401 demonstrate lack of blood flow in a short segment of the superior mesenteric artery, however, blood flow appears reconstituted distally to this at the transition from images 90-91. This is indicative of either incomplete obstruction, or perfusion from collateral vasculature. Celiac axis and inferior mesenteric artery are both patent. High-grade  stenosis at the ostium of the right renal artery. Superior mesenteric vein and portal vein are widely patent. Circumaortic left renal vein (normal anatomical variant) incidentally noted. No lymphadenopathy noted in the abdomen or pelvis.  Reproductive: Status post hysterectomy. Ovaries are unremarkable in appearance.  Other: Large amount of high attenuation fluid immediately deep to the midline surgical wound, compatible with a large postoperative hematoma. This is in the subcutaneous fat of the anterior abdominal wall and measures up to 3.3 x 3.0 cm on axial images, spanning a total length of approximately 21.6 cm in the midline. Small volume of ascites. No pneumoperitoneum.  Musculoskeletal: There are no aggressive appearing lytic or blastic lesions noted in the visualized portions of the skeleton.  Review of the MIP images confirms the above findings.  IMPRESSION: 1. Status post SMA embolectomy, with restoration of flow in the superior mesenteric artery, as above. The proximal superior mesenteric  artery is aneurysmal, and there is an intimal abnormality, which may represent a small amount of residual proximal clot, or a postoperative change. The vessel is generally widely patent, with exception of a short segment which appears either partially or completely occluded. There is distal reconstitution of flow, which could be from incomplete occlusion, or from collateral flow. 2. Mucosal enhancement throughout the small bowel and colon, without definite areas suspicious for ischemia on today's examination. There are some mildly thickened and hyperenhancing loops of proximal ileum, which may be a response to recent revascularization. 3. Large hematoma deep to the skin sutures in the subcutaneous fat of the anterior abdominal wall in the midline. 4. High-grade stenosis at the ostium of the right renal artery. Clinical correlation for signs and symptoms of renovascular hypertension is recommended. 5. Small volume of ascites. 6. Small bilateral pleural effusions lying dependently with postoperative subsegmental atelectasis in the lower lobes of the lungs bilaterally. 7. Additional incidental findings, as above.   Electronically Signed   By: Trudie Reed M.D.   On: 06/16/2015 17:43        ASSESSMENT / PLAN:  PULMONARY OETT 8/25>> A: VDRF S/P Ex Lap Mucus Plugging - resolved Bibasilar atx and small effusions She has boarderline but acceptable Ve for her TBW. Her mental status is the calmest that it has been the entire admit on precedex.    - fails SBT due to agitation   Plan Vent bundle One way extubation next 24-48h; family awaited   CARDIOVASCULAR CVL 8/25 rt i j >> A:  Hypotension - Resolved. Secondary to blood loss and sedation on 9/4 A Fib w/ RVR - deemed not a candidate for anticoagulation due to falls but on heparin per surgery H/O HTN Mild AS & MS on TTE   - on neo due to precedex but also on dig, lopressor and hydralazine  P:  Cardiology following Digoxin IV daily  Dc  Lopressor IV every 4 hours DC hydralazine prn Avoiding amiodarone given potential for cardioversion Off Heparin gtt due to abdominal wall hematoma Low dose neo to allow for agitation rx Scheduled lasix for 3rd spacing    RENAL A:  Nil acute  P:   Replace lytes as needed Strict I&O Trend daily BUN/creatinine along with urine output  GASTROINTESTINAL A:  S/P Ex Lap POD #10 Superior Mesenteric Artery Thrombus C diff Colonization vs Infection w/ Diarrhea Abdominal Wall Hematoma   -on tube feeds with loose tube feed stools  P:    pepcid Dc colace Dc dulcolax Tube feeds per dietary See ID Section  HEMATOLOGIC A:   Leukocytosis - Likely reactive Anemia - due to abdominal wall hematoma  P:  Holding heparin gtt due to hematoma Monitoring daily hemoglobin with CBC Threshold for transfusion PRBC Hgb <8.0 or hypotension w/ blood loss SCDs for DVT prophylaxis  INFECTIOUS A:   C diff Colonization vs Infection Fever spike and rising leukocytosis 9/4, CT abd w/ as expected changes.. Did have large hematoma noted. Wonder about likelihood of this being a potential source of infection as well. She has been pan-cultured. Will hold off on empiric HCAP coverage until we see any results as we are still treating for Cdiff.    ongoing fevers +   P:   Monitor for fever & plan for panculture for temp greater than 38.0 C Trending leukocytosis Check PCT Abx:  Vanco PO 8/30>> Flagyl 8/30>> 8/25 zoysn>>8/25 8/25 zinacef>>8/26 Repeat sputum 9/4>>> BC 9/4>>> UC 9/4>>>  ENDOCRINE A:   H/O DM Hypoglycemia to 68 on 9/2  P:   Sliding-scale insulin per algorithm Accu-Checks every 4 hours  NEUROLOGIC A:   H/O Dementia-->superimposed acute encephalopathy  Postoperative abdominal pain   - tenderness abd improved. Agitated delirium + on WUA  P:   RASS goal: 0 to -1 Cont precedex  FAMILY  - Updates: Family meeting held 9/1 with Dr. Hart Rochester present. Family updated regarding  her current clinical state and failure to wean from ventilator.  - Inter-disciplinary family meet or Palliative Care meeting:  9/1 w/ Dr. Lysbeth Galas, Benita, & other family members. 06/17/15 done by Anders Simmonds - APP with family - possible one way extubation next 24-48h. Family not at bedside       The patient is critically ill with multiple organ systems failure and requires high complexity decision making for assessment and support, frequent evaluation and titration of therapies, application of advanced monitoring technologies and extensive interpretation of multiple databases.   Critical Care Time devoted to patient care services described in this note is  40  Minutes. This time reflects time of care of this signee Dr Kalman Shan. This critical care time does not reflect procedure time, or teaching time or supervisory time of PA/NP/Med student/Med Resident etc but could involve care discussion time    Dr. Kalman Shan, M.D., [facility].C.P Pulmonary and Critical Care Medicine Staff Physician  System [facility] Pager: [phone], If no answer or between  15:00h - 7:00h: call [phone]  06/18/2015 8:19 AM

## 2015-06-19 ENCOUNTER — Inpatient Hospital Stay (HOSPITAL_COMMUNITY): Payer: Medicare (Managed Care)

## 2015-06-19 DIAGNOSIS — A4152 Sepsis due to Pseudomonas: Secondary | ICD-10-CM

## 2015-06-19 DIAGNOSIS — Z66 Do not resuscitate: Secondary | ICD-10-CM

## 2015-06-19 DIAGNOSIS — B965 Pseudomonas (aeruginosa) (mallei) (pseudomallei) as the cause of diseases classified elsewhere: Secondary | ICD-10-CM

## 2015-06-19 DIAGNOSIS — Z515 Encounter for palliative care: Secondary | ICD-10-CM

## 2015-06-19 DIAGNOSIS — N390 Urinary tract infection, site not specified: Secondary | ICD-10-CM

## 2015-06-19 DIAGNOSIS — R7881 Bacteremia: Secondary | ICD-10-CM

## 2015-06-19 DIAGNOSIS — Z9911 Dependence on respirator [ventilator] status: Secondary | ICD-10-CM

## 2015-06-19 DIAGNOSIS — G9340 Encephalopathy, unspecified: Secondary | ICD-10-CM

## 2015-06-19 LAB — GLUCOSE, CAPILLARY
Glucose-Capillary: 148 mg/dL — ABNORMAL HIGH (ref 65–99)
Glucose-Capillary: 193 mg/dL — ABNORMAL HIGH (ref 65–99)
Glucose-Capillary: 186 mg/dL — ABNORMAL HIGH (ref 65–99)

## 2015-06-19 LAB — CBC WITH DIFFERENTIAL/PLATELET
Basophils Absolute: 0 10*3/uL (ref 0.0–0.1)
Basophils Relative: 0 % (ref 0–1)
EOS PCT: 0 % (ref 0–5)
Eosinophils Absolute: 0 10*3/uL (ref 0.0–0.7)
HEMATOCRIT: 27.4 % — AB (ref 36.0–46.0)
Hemoglobin: 9.2 g/dL — ABNORMAL LOW (ref 12.0–15.0)
Lymphocytes Relative: 4 % — ABNORMAL LOW (ref 12–46)
Lymphs Abs: 1.3 10*3/uL (ref 0.7–4.0)
MCH: 30.4 pg (ref 26.0–34.0)
MCHC: 33.6 g/dL (ref 30.0–36.0)
MCV: 90.4 fL (ref 78.0–100.0)
MONO ABS: 1.3 10*3/uL — AB (ref 0.1–1.0)
MONOS PCT: 4 % (ref 3–12)
NEUTROS PCT: 92 % — AB (ref 43–77)
Neutro Abs: 29.4 10*3/uL — ABNORMAL HIGH (ref 1.7–7.7)
PLATELETS: 223 10*3/uL (ref 150–400)
RBC: 3.03 MIL/uL — AB (ref 3.87–5.11)
RDW: 15 % (ref 11.5–15.5)
WBC MORPHOLOGY: INCREASED
WBC: 32 10*3/uL — AB (ref 4.0–10.5)

## 2015-06-19 LAB — CULTURE, RESPIRATORY W GRAM STAIN

## 2015-06-19 LAB — BLOOD GAS, ARTERIAL
Acid-base deficit: 3.4 mmol/L — ABNORMAL HIGH (ref 0.0–2.0)
BICARBONATE: 20.3 meq/L (ref 20.0–24.0)
Drawn by: 345601
FIO2: 0.4
LHR: 12 {breaths}/min
O2 Saturation: 97.5 %
PEEP: 5 cmH2O
PO2 ART: 104 mmHg — AB (ref 80.0–100.0)
Patient temperature: 100.1
TCO2: 21.3 mmol/L (ref 0–100)
VT: 400 mL
pCO2 arterial: 33.1 mmHg — ABNORMAL LOW (ref 35.0–45.0)
pH, Arterial: 7.409 (ref 7.350–7.450)

## 2015-06-19 LAB — URINE CULTURE: Culture: >=100000

## 2015-06-19 LAB — RENAL FUNCTION PANEL
Albumin: 1.3 g/dL — ABNORMAL LOW (ref 3.5–5.0)
Anion gap: 11 (ref 5–15)
BUN: 57 mg/dL — ABNORMAL HIGH (ref 6–20)
CHLORIDE: 111 mmol/L (ref 101–111)
CO2: 21 mmol/L — ABNORMAL LOW (ref 22–32)
Calcium: 8.6 mg/dL — ABNORMAL LOW (ref 8.9–10.3)
Creatinine, Ser: 1.49 mg/dL — ABNORMAL HIGH (ref 0.44–1.00)
GFR, EST AFRICAN AMERICAN: 36 mL/min — AB (ref >60)
GFR, EST NON AFRICAN AMERICAN: 31 mL/min — AB (ref >60)
Glucose, Bld: 193 mg/dL — ABNORMAL HIGH (ref 65–99)
POTASSIUM: 3.3 mmol/L — AB (ref 3.5–5.1)
Phosphorus: 3.7 mg/dL (ref 2.5–4.6)
Sodium: 143 mmol/L (ref 135–145)

## 2015-06-19 LAB — PROCALCITONIN: PROCALCITONIN: 13.99 ng/mL

## 2015-06-19 LAB — CULTURE, RESPIRATORY: GRAM STAIN: NONE SEEN

## 2015-06-19 LAB — MAGNESIUM: MAGNESIUM: 2.2 mg/dL (ref 1.7–2.4)

## 2015-06-19 MED ORDER — SODIUM CHLORIDE 0.9 % IV SOLN
10.0000 ug/h | INTRAVENOUS | Status: DC
  Filled 2015-06-19: qty 50

## 2015-06-19 MED ORDER — DEXTROSE 5 % IV SOLN
2.0000 g | INTRAVENOUS | Status: DC
  Administered 2015-06-19: 2 g via INTRAVENOUS
  Filled 2015-06-19: qty 2

## 2015-06-19 MED ORDER — FENTANYL CITRATE (PF) 100 MCG/2ML IJ SOLN
50.0000 ug | Freq: Once | INTRAMUSCULAR | Status: AC
  Administered 2015-06-19: 50 ug via INTRAVENOUS

## 2015-06-19 MED ORDER — FAMOTIDINE 40 MG/5ML PO SUSR
20.0000 mg | Freq: Every day | ORAL | Status: DC
  Administered 2015-06-19: 20 mg
  Filled 2015-06-19: qty 2.5

## 2015-06-19 MED ORDER — FUROSEMIDE 10 MG/ML IJ SOLN
20.0000 mg | Freq: Once | INTRAMUSCULAR | Status: AC
  Administered 2015-06-19: 20 mg via INTRAVENOUS
  Filled 2015-06-19: qty 2

## 2015-06-19 MED ORDER — FENTANYL CITRATE (PF) 100 MCG/2ML IJ SOLN
25.0000 ug | INTRAMUSCULAR | Status: DC | PRN
  Administered 2015-06-19: 50 ug via INTRAVENOUS
  Filled 2015-06-19: qty 2

## 2015-06-19 MED ORDER — SODIUM CHLORIDE 0.9 % IV SOLN
1.0000 mg/h | INTRAVENOUS | Status: DC
  Filled 2015-06-19: qty 10

## 2015-06-19 MED ORDER — FENTANYL BOLUS VIA INFUSION
50.0000 ug | INTRAVENOUS | Status: DC | PRN
  Administered 2015-06-19: 25 ug via INTRAVENOUS
  Filled 2015-06-19: qty 200

## 2015-06-19 MED ORDER — SODIUM CHLORIDE 0.9 % IV SOLN
0.0000 mg/h | INTRAVENOUS | Status: DC
  Administered 2015-06-19: 1 mg/h via INTRAVENOUS
  Filled 2015-06-19: qty 10

## 2015-06-19 MED ORDER — MIDAZOLAM HCL 2 MG/2ML IJ SOLN: 1.0000 mg | INTRAMUSCULAR | Status: DC | PRN

## 2015-06-19 MED ORDER — MIDAZOLAM BOLUS VIA INFUSION
5.0000 mg | INTRAVENOUS | Status: DC | PRN
  Filled 2015-06-19: qty 20

## 2015-06-19 MED ORDER — SODIUM CHLORIDE 0.9 % IV SOLN
25.0000 ug/h | INTRAVENOUS | Status: DC
  Administered 2015-06-19: 100 ug/h via INTRAVENOUS
  Filled 2015-06-19: qty 50

## 2015-06-19 MED ORDER — MIDAZOLAM BOLUS VIA INFUSION
1.0000 mg | INTRAVENOUS | Status: DC | PRN
  Filled 2015-06-19: qty 2

## 2015-06-19 MED ORDER — FENTANYL BOLUS VIA INFUSION
25.0000 ug | INTRAVENOUS | Status: DC | PRN
  Filled 2015-06-19: qty 25

## 2015-06-19 NOTE — Progress Notes (Signed)
ANTIBIOTIC CONSULT NOTE - FOLLOW UP  Pharmacy Consult for fortaz Indication: pseudomonas bactermia  No Known Allergies  Patient Measurements: Height: 5' 1.42" (156 cm) Weight: 145 lb 8.1 oz (66 kg) IBW/kg (Calculated) : 48.76   Vital Signs: Temp: 97.4 F (36.3 C) (09/07 0804) Temp Source: Axillary (09/07 0804) BP: 108/52 mmHg (09/07 0900) Pulse Rate: 98 (09/07 0757) Intake/Output from previous day: 09/06 0701 - 09/07 0700 In: 3665.4 [I.V.:1365.4; NG/GT:1470; IV Piggyback:650] Out: 1480 [Urine:580; Stool:900] Intake/Output from this shift: Total I/O In: 211.9 [I.V.:56.9; NG/GT:55; IV Piggyback:100] Out: 350 [Urine:100; Stool:250]  Labs:  Recent Labs  06/17/15 0143 06/18/15 0545 22-Jun-2015 0743  WBC 24.9* 24.9* 32.0*  HGB 7.3* 8.2* 9.2*  PLT 240 236 223  CREATININE 1.10* 1.34* 1.49*   Estimated Creatinine Clearance: 24.3 mL/min (by C-G formula based on Cr of 1.49). No results for input(s): VANCOTROUGH, VANCOPEAK, VANCORANDOM, GENTTROUGH, GENTPEAK, GENTRANDOM, TOBRATROUGH, TOBRAPEAK, TOBRARND, AMIKACINPEAK, AMIKACINTROU, AMIKACIN in the last 72 hours.   Microbiology: Recent Results (from the past 720 hour(s))  MRSA PCR Screening     Status: None   Collection Time: 05/15/2015  5:24 AM  Result Value Ref Range Status   MRSA by PCR NEGATIVE NEGATIVE Final    Comment:        The GeneXpert MRSA Assay (FDA approved for NASAL specimens only), is one component of a comprehensive MRSA colonization surveillance program. It is not intended to diagnose MRSA infection nor to guide or monitor treatment for MRSA infections.   Clostridium Difficile by PCR     Status: Abnormal   Collection Time: 06/10/15  9:25 AM  Result Value Ref Range Status   Toxigenic C Difficile by pcr POSITIVE (A) NEGATIVE Final    Comment: CRITICAL RESULT CALLED TO, READ BACK BY AND VERIFIED WITH: WILSON,K RN @ 1029 06/10/15 LEOANRD,A   C difficile quick scan w PCR reflex     Status: Abnormal   Collection Time: 06/10/15  2:56 PM  Result Value Ref Range Status   C Diff antigen POSITIVE (A) NEGATIVE Final   C Diff toxin NEGATIVE NEGATIVE Final   C Diff interpretation   Final    C. difficile present, but toxin not detected. This indicates colonization. In most cases, this does not require treatment. If patient has signs and symptoms consistent with colitis, consider treatment.  Culture, respiratory (NON-Expectorated)     Status: None   Collection Time: 06/16/15 12:28 PM  Result Value Ref Range Status   Specimen Description TRACHEAL ASPIRATE  Final   Special Requests NONE  Final   Gram Stain   Final    NO WBC SEEN NO SQUAMOUS EPITHELIAL CELLS SEEN NO ORGANISMS SEEN Performed at [facility]    Culture   Final    ABUNDANT MORAXELLA CATARRHALIS(BRANHAMELLA) Note: BETA LACTAMASE POSITIVE Performed at [facility]    Report Status 06/22/2015 FINAL  Final  Urine culture     Status: None (Preliminary result)   Collection Time: 06/17/15  1:41 AM  Result Value Ref Range Status   Specimen Description URINE, CATHETERIZED  Final   Special Requests NONE  Final   Culture >=100,000 COLONIES/mL PSEUDOMONAS AERUGINOSA  Final   Report Status PENDING  Incomplete  Culture, blood (routine x 2)     Status: None (Preliminary result)   Collection Time: 06/17/15  1:43 AM  Result Value Ref Range Status   Specimen Description BLOOD LEFT WRIST  Final   Special Requests IN PEDIATRIC BOTTLE [facility]  Final   Culture  Setup Time   Final    GRAM NEGATIVE RODS AEROBIC BOTTLE ONLY CRITICAL RESULT CALLED TO, READ BACK BY AND VERIFIED WITH: Woodroe Chen RN 17:05 06/17/15 ([email or web address])    Culture PSEUDOMONAS AERUGINOSA GRAM POSITIVE COCCI   Final   Report Status PENDING  Incomplete  Culture, blood (routine x 2)     Status: None (Preliminary result)   Collection Time: 06/17/15  1:50 AM  Result Value Ref Range Status   Specimen Description BLOOD RIGHT HAND  Final   Special Requests BOTTLES DRAWN  AEROBIC ONLY 6CC  Final   Culture  Setup Time   Final    GRAM NEGATIVE RODS GRAM POSITIVE COCCI IN CLUSTERS AEROBIC BOTTLE ONLY CRITICAL RESULT CALLED TO, READ BACK BY AND VERIFIED WITH: Woodroe Chen RN [id number] 06/17/15 A BROWNING    Culture   Final    PSEUDOMONAS AERUGINOSA STAPHYLOCOCCUS SPECIES (COAGULASE NEGATIVE) SUSCEPTIBILITIES TO FOLLOW    Report Status PENDING  Incomplete   Organism ID, Bacteria PSEUDOMONAS AERUGINOSA  Final      Susceptibility   Pseudomonas aeruginosa - MIC*    CEFTAZIDIME 2 SENSITIVE Sensitive     CIPROFLOXACIN <=0.25 SENSITIVE Sensitive     GENTAMICIN <=1 SENSITIVE Sensitive     IMIPENEM 2 SENSITIVE Sensitive     PIP/TAZO 16 SENSITIVE Sensitive     CEFEPIME <=1 SENSITIVE Sensitive     * PSEUDOMONAS AERUGINOSA    Anti-infectives    Start     Dose/Rate Route Frequency Ordered Stop   07/04/2015 1000  cefTAZidime (FORTAZ) 2 g in dextrose 5 % 50 mL IVPB     2 g 100 mL/hr over 30 Minutes Intravenous Every 24 hours 06/25/2015 0906     06/18/15 1900  vancomycin (VANCOCIN) IVPB 1000 mg/200 mL premix  Status:  Discontinued     1,000 mg 200 mL/hr over 60 Minutes Intravenous Every 24 hours 06/17/15 1850 06/23/2015 0902   06/18/15 0300  piperacillin-tazobactam (ZOSYN) IVPB 3.375 g  Status:  Discontinued     3.375 g 12.5 mL/hr over 240 Minutes Intravenous 3 times per day 06/17/15 1851  0902   06/17/15 1900  piperacillin-tazobactam (ZOSYN) IVPB 3.375 g  Status:  Discontinued     3.375 g 12.5 mL/hr over 240 Minutes Intravenous 3 times per day 06/17/15 1850 06/17/15 1851   06/17/15 1900  piperacillin-tazobactam (ZOSYN) IVPB 3.375 g     3.375 g 100 mL/hr over 30 Minutes Intravenous  Once 06/17/15 1852 06/17/15 2030   06/17/15 1830  vancomycin (VANCOCIN) 1,250 mg in sodium chloride 0.9 % 250 mL IVPB     1,250 mg 166.7 mL/hr over 90 Minutes Intravenous  Once 06/17/15 1827 06/17/15 2130   06/17/15 1830  piperacillin-tazobactam (ZOSYN) IVPB 3.375 g  Status:  Discontinued      3.375 g 100 mL/hr over 30 Minutes Intravenous  Once 06/17/15 1827 06/17/15 1851   06/11/15 1600  vancomycin (VANCOCIN) 50 mg/mL oral solution 500 mg     500 mg Oral Every 6 hours 06/11/15 1420 06/25/15 1559   06/11/15 0900  metroNIDAZOLE (FLAGYL) IVPB 500 mg     500 mg 100 mL/hr over 60 Minutes Intravenous Every 8 hours 06/11/15 0800     05/14/2015 1200  cefUROXime (ZINACEF) 1.5 g in dextrose 5 % 50 mL IVPB     1.5 g 100 mL/hr over 30 Minutes Intravenous Every 12 hours 05/24/2015 0424 06/07/15 0116   06/02/2015 0200  piperacillin-tazobactam (ZOSYN) IVPB 3.375 g     3.375 g 12.5 mL/hr  over 240 Minutes Intravenous To Surgery 05/21/2015 0158 05/24/2015 0330   06/02/2015 0147  vancomycin (VANCOCIN) 1 GM/200ML IVPB    Comments:  Toney Sang   : cabinet override      05/24/2015 0147 05/24/2015 0225      Assessment: 81 yo female of zosyn for pseudomonal bacteremia and UTI to change to fortaz. She is also noted on vancomycin po and flagyl IV for c diff. WBC= 32, tmax= 100.1, SCr= 1.49 and CrCl ~ 25  C diff toxin 8/29>>negative C diff antigen 8/29>>positive  Abx:  9/7 fortaz 9/5 IV vanc > 9/7 8/30 flagyl > 8/30 PO vanc >> (9/13) 8/25 zoysn>> 8/25, 9/5 > 9/7 8/25 zinacef>> 8/26  9/4 resp cx: moraxella cat 9/5 blood cx (2/2): pseudomonas (pan-sensitive) + CONS 9/5 urine cx: 100 K pseudomonas  Plan:  -Fortaz 2gm IV q24hr -Will follow renal function, cultures and clinical progress  Harland German, Pharm D 10-Jul-2015 9:55 AM

## 2015-06-19 NOTE — Progress Notes (Signed)
   VASCULAR SURGERY ASSESSMENT & PLAN:  * 13 Days Post-Op s/p: SMA embolectomy  * No progress. Family considering extubation with DNR/DNI  * Tolerating tube feeds  * Being treated for c. diff   SUBJECTIVE: Arousable  PHYSICAL EXAM: Filed Vitals:   07/01/2015 0615 07/05/2015 0630 06/17/2015 0645 07/04/2015 0700  BP: 123/70 123/62 99/74 96/69   Pulse:      Temp:      TempSrc:      Resp: Height:      Weight:      SpO2: 100% 100% 100% 100%   Abdomen: has bowel sounds Lungs: good air exchange Feet warm   LABS: Lab Results  Component Value Date   WBC 24.9* 06/18/2015   HGB 8.2* 06/18/2015   HCT 25.6* 06/18/2015   MCV 90.1 06/18/2015   PLT 236 06/18/2015   Lab Results  Component Value Date   CREATININE 1.34* 06/18/2015   Lab Results  Component Value Date   INR 1.37 June 18, 2015   CBG (last 3)   Recent Labs  06/18/15 1922 06/18/15 2344 06/15/2015 0348  GLUCAP 168* 156* 148*    Active Problems:   Acute mesenteric ischemia   Shock circulatory   Atrial fibrillation with RVR   Ventilator dependence   Respiratory failure   Respiratory failure requiring intubation   Pneumonia   Hypoxia    Cari Caraway Beeper: [phone] 07/04/2015

## 2015-06-19 NOTE — Progress Notes (Signed)
   07/02/2015 1400  Clinical Encounter Type  Visited With Patient and family together  Visit Type Spiritual support  Referral From Nurse  Consult/Referral To Chaplain  Recommendations Evening shift if available could stop by.  Spiritual Encounters  Spiritual Needs Prayer  Stress Factors  Patient Stress Factors None identified  Family Stress Factors None identified  Nurse requested chaplain visit for end-of-life. Patient not awake. Four adult children present. Offered prayer and family accepted. Prayed with them and asked if they were OK. All four children indicated they were satisfied with decision to stop machines and said no further help necessary, although a visit from a chaplain this evening could be helpful to them.

## 2015-06-19 NOTE — Progress Notes (Signed)
RT pulled vent with family in room. Denied chaplain. Pt suctioned and resting comfortably. Family instructed to notify RN for any and all needs. Modena Jansky Rn 2 [place]

## 2015-06-19 NOTE — Progress Notes (Addendum)
30 min face to face IDT meeting with 2 daughter and 2 sons (other 4 kids could not come) and several grandkids in [place] of RN Misty Stanley and Caryn Bee  - expalined poor prognosis, chronic critical illness, LTAC, trach, high mortality morbidity situation over next few to several months  - they explained patient as mild dementia but still doing ADLs and strong willed but values quality of life and has living will about DNAR and comfort if terminal or vegetative  Based on all this   - they agreed for terminal wean 06/25/2015 or 07-07-2015 once they can contact all family member - in interim, DNAR with cap on pressors to neo 200 + no labs + start fent/versed gtt + continue other medical care   - if terminal wean expect to live hours to days   30 min additional critical care time   Dr. Kalman Shan, M.D., [facility].C.P Pulmonary and Critical Care Medicine Staff Physician [facility] [place] and Critical [facility] Pager: [phone], If no answer or between  15:00h - 7:00h: call [phone]  07/11/2015 10:55 AM

## 2015-06-19 NOTE — Progress Notes (Signed)
[facility]  Subjective  - on vent pending family decision.   Objective 96/69 120 100.1 F (37.8 C) (Oral) 26 100%  Intake/Output Summary (Last 24 hours) at 07/01/2015 0728 Last data filed at 06/18/2015 0700  Gross per 24 hour  Intake 3665.4 ml  Output   1480 ml  Net 2185.4 ml   Abdomin soft Extremities supported   Assessment/Planning: POD # 13 Post-Op s/p: SMA embolectomy Palliative consult verses family decision pending extubation and DNR Leukocytosis 06/18/2015 24.9 unable to obtain new lab draw this am UO 40-60 cc/hr   Clinton Gallant [facility] 06/18/2015 7:28 AM --  Laboratory Lab Results:  Recent Labs  06/17/15 0143 06/18/15 0545  WBC 24.9* 24.9*  HGB 7.3* 8.2*  HCT 22.3* 25.6*  PLT 240 236   BMET  Recent Labs  06/17/15 0143 06/18/15 0545  NA 140 141  K 4.4 4.2  CL 108 109  CO2 24 20*  GLUCOSE 183* 215*  BUN 23* 39*  CREATININE 1.10* 1.34*  CALCIUM 8.6* 8.5*    COAG Lab Results  Component Value Date   INR 1.37 06-24-2015   No results found for: PTT

## 2015-06-19 NOTE — Progress Notes (Signed)
Page from [employer]. I am at remote location. Family ready for terminal wean. Acknowledged. HE released the orders. Other superfluous orders dc'ed  Dr. Kalman Shan, M.D., [facility].C.P Pulmonary and Critical Care Medicine Staff Physician [facility] Pulmonary and Critical Care Pager: [phone], If no answer or between  15:00h - 7:00h: call [phone]  06/22/2015 12:58 PM

## 2015-06-19 NOTE — Progress Notes (Addendum)
Family notified williningness to withdraw care. Dr. Elvera Lennox and RT notified. Comfort cart ordered for family and family brought back. Modena Jansky RN 2 [place]

## 2015-06-19 NOTE — Progress Notes (Addendum)
PULMONARY / CRITICAL CARE MEDICINE   Name: Amber Hood MRN: [id number] DOB: [birth year]    ADMISSION DATE:  05/26/2015 CONSULTATION DATE: 8/25  REFERRING MD :  Hart Rochester  CHIEF COMPLAINT:  Abd pain  INITIAL PRESENTATION: To [facility] with abd pain  HISTORY OF PRESENT ILLNESS:   81 yo AAM with history of dementia/falls, Afib but not on anticoagulation due to falls, who presented to [facility] 8/24 with acute abd pain. She was transferred to [facility] and to Care of Vascular surgery for exp lap with superior mesenteric thrombectomy. She returned from OR to SICU and PCCM asked to assist in post op management.  STUDIES:  TTE 8/28 - EF 65-70%. No regional wall motion abnormalities. Couldn't assess diastolic. Mild aortic stenosis. Mild mitral stenosis. RV normal in size and function. CT abd/pelvis 9/4: 1. 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 The vessel is generally widely patent, with exception of a short segment which appears either partially or completely occluded. There is distal reconstitution of flow, which could be from incomplete occlusion, or from collateral flow.2. Mucosal enhancement throughout the small bowel and colon, without definite areas suspicious for ischemia on today's examination. There are some mildly thickened and hyperenhancing loops of proximal ileum, which may be a response to recent revascularization.3. Large hematoma deep to the skin sutures in the subcutaneous fat of the anterior abdominal wall in the midline.4. High-grade stenosis at the ostium of the right renal artery.Clinical correlation for signs and symptoms of renovascular hypertension is recommended.5. Small volume of ascites.6. Small bilateral pleural effusions lying dependently with postoperative subsegmental atelectasis in the lower lobes of the lungs bilaterally.  SIGNIFICANT  EVENTS: 8/25 - Admitted to [facility] 8/25 - S/P Ex Lap & thrombectomy for mesenteric thrombus 06/09/15 - ECHO ef 65% 8/31 - 1u PRBC for abdominal wall hematoma 9/2 failed sbt, lasix  9/3 cuff blown. Re-intubated. Lasix increased. Decreased fentanyl, stopped ativan and added PRN haldol. Did OK w/ haldol.  9/4 very agitated. Failed SBT. RR 30s. Fluid balance finally >2 liters. Spiking fevers. Sputum sent. CT abd/pelvis ordered. Hypotensive after haldol. Hold lasix today. Neo gtt. precedex in order to avoid swings in agitation and sedation. Blood and urine sent later that evening when temp spiked >103.  9/5 calm on precedex. BP holding but requiring neo to do so w/ precedex on board. SBT initiated. F/VT boarderline at just under 100. Resuming lasix for positive balance  06/17/15 calm on precedex.   06/18/2015 : on precedex gtt. Reports are that she gets agitated on WUA. Goals of care discussion yesterday - likely one way extubation by 06/24/2015. DNR/DNI/palliation if she does not do well with extubation. No trach. Family not at bedside. PAtient on precedex and neo byut also getting lopressor and dig. Having loose stools - also on colace and dulcolax    SUBJECTIVE/OVERNIGHT/INTERVAL HX 06/21/2015 - ongoing diarrhea worse despite dc of laxatives. On precedex and neo. Needing more fent prn for agitation. Failed SBT after 4h yeserday.  Family arriving at 9.30am. Creat worsening   VITAL SIGNS: Temp:  [96.8 F (36 C)-100.1 F (37.8 C)] 97.4 F (36.3 C) (09/07 0804) Pulse Rate:  [98-120] 98 (09/07 0757) Resp:  [17-35] 24 (09/07 0800) BP: (70-134)/(14-80) 88/72 mmHg (09/07 0800) SpO2:  [93 %-100 %] 100 % (09/07 0800) FiO2 (%):  [30 %-40 %] 30 % (09/07 0757) Weight:  [66 kg (145 lb 8.1  oz)] 66 kg (145 lb 8.1 oz) (09/07 0445) HEMODYNAMICS:   VENTILATOR SETTINGS: Vent Mode:  [-] PRVC FiO2 (%):  [30 %-40 %] 30 % Set Rate:  [12 bmp] 12 bmp Vt Set:  [400 mL] 400 mL PEEP:  [5 cmH20] 5 cmH20 Plateau  Pressure:  [21 cmH20-25 cmH20] 21 cmH20 INTAKE / OUTPUT:  Intake/Output Summary (Last 24 hours) at 06/16/2015 0844 Last data filed at 06/24/2015 0700  Gross per 24 hour  Intake 3467.3 ml  Output   1430 ml  Net 2037.3 ml    PHYSICAL EXAMINATION: General:  Elderly female. Calm on precedex.  Very deconditioned Neuro: RASS -3 on prcedex Cardiovascular:  A. fib on tele. Normal rate. No edema. -->HR increased w/ weaning efforts Lungs:  Symmetric chest wall rise on ventilator. Scattered rhonchi  Abdomen: Abdominal incision with staples intact. Normoactive bowel sounds. Hematoma palpated to the left of abdominal incision less firm today. Musculoskeletal: No joint deformity or effusion appreciated. ANASARCA + Skin: Warm and dry. No rash on exposed skin. Abdominal incision in tact. Mild bruising around incision.  LABS:  PULMONARY  Recent Labs Lab 07/02/2015 0354  PHART 7.409  PCO2ART 33.1*  PO2ART 104*  HCO3 20.3  TCO2 21.3  O2SAT 97.5    CBC  Recent Labs Lab 06/17/15 0143 06/18/15 0545 07/02/2015 0743  HGB 7.3* 8.2* 9.2*  HCT 22.3* 25.6* 27.4*  WBC 24.9* 24.9* 32.0*  PLT 240 236 223    COAGULATION No results for input(s): INR in the last 168 hours.  CARDIAC  No results for input(s): TROPONINI in the last 168 hours. No results for input(s): PROBNP in the last 168 hours.   CHEMISTRY  Recent Labs Lab 06/14/15 0500  06/15/15 0355 06/15/15 1833 06/16/15 0510 06/17/15 0143 06/18/15 0545 07/09/2015 0743  NA 140  < > 139 141 139 140 141 143  K 2.7*  < > 3.8 4.7 4.8 4.4 4.2 3.3*  CL 109  < > 109 109 106 108 109 111  CO2 26  < > 26 24 27 24  20* 21*  GLUCOSE 106*  < > 196* 142* 164* 183* 215* 193*  BUN 7  < > 10 12 14  23* 39* 57*  CREATININE 0.81  < > 0.77 0.82 0.83 1.10* 1.34* 1.49*  CALCIUM 8.6*  < > 8.3* 8.4* 8.2* 8.6* 8.5* 8.6*  MG 1.6*  < > 1.8  --  1.6* 2.1 2.0 2.2  PHOS 2.6  --  2.4*  --  2.8 2.0*  --  3.7  < > = values in this interval not displayed. Estimated  Creatinine Clearance: 24.3 mL/min (by C-G formula based on Cr of 1.49).   LIVER  Recent Labs Lab 06/15/15 0355 06/16/15 0510 06/17/15 0143 06/18/15 0545 07/09/2015 0743  AST  --   --   --  40  --   ALT  --   --   --  17  --   ALKPHOS  --   --   --  74  --   BILITOT  --   --   --  1.1  --   PROT  --   --   --  5.0*  --   ALBUMIN 1.5* 1.5* 1.5* 1.5* 1.3*     INFECTIOUS  Recent Labs Lab 06/13/15 0501 06/18/15 0545  PROCALCITON 0.51 10.23     ENDOCRINE CBG (last 3)   Recent Labs  06/18/15 2344 06/26/2015 0348 06/15/2015 0758  GLUCAP 156* 148* 193*  IMAGING x48h  - image(s) personally visualized  -   highlighted in bold Dg Chest Port 1 View  June 27, 2015   CLINICAL DATA:  Ventilator dependent respiratory failure, mesenteric ischemia, circulatory shock.  EXAM: PORTABLE CHEST - 1 VIEW  COMPARISON:  Portable chest x-ray of June 16, 2015  FINDINGS: The lungs are slightly less well inflated today. The interstitial markings are increased with areas of new alveolar opacity bilaterally. The retrocardiac region on the left is more dense and left hemidiaphragm is now obscured. The cardiac silhouette is normal in size. The pulmonary vascularity is indistinct.  The endotracheal tube tip lies 4.6 cm above the carina. The esophagogastric tube tip projects below the inferior margin of the image. The right internal jugular Cordis sheath tip projects over the proximal SVC.  IMPRESSION: 1. Worsening of interstitial and alveolar opacities compatible with edema. Worsening of left lower lobe atelectasis. There is no significant pleural effusion and no pneumothorax. 2. The support tubes are in reasonable position.   Electronically Signed   By: David  [place] M.D.   On: Jun 27, 2015 07:21        ASSESSMENT / PLAN:  PULMONARY OETT 8/25>> A: VDRF S/P Ex Lap Mucus Plugging - resolved Bibasilar atx and small effusions She has boarderline but acceptable Ve for her TBW. Her mental status  is the calmest that it has been the entire admit on precedex.    - fails SBT due to agitation and resp muscule fatigue LOS is 13 on 2015-06-27   Plan Vent bundle Family awaited 06/27/15 to rediscus goals - likely one way extubation -we are at [place] iwith direction of care - trach v extubate   CARDIOVASCULAR CVL 8/25 rt i j >> A:  Hypotension - Resolved. Secondary to blood loss and sedation on 9/4 A Fib w/ RVR - deemed not a candidate for anticoagulation due to falls but on heparin per surgery H/O HTN Mild AS & MS on TTE   - on neo due to precedex but also on dig. Not on anticoagualtion  P:  Cardiology following Digoxin IV daily  Avoiding amiodarone given potential for cardioversion Off Heparin gtt due to abdominal wall hematoma - ? Restart but depends on goals of care Low dose neo to allow for agitation rx Lasix x 1  for 3rd spacing    RENAL A:  Worsening creat  P:  Lasix x 1 and reassess  Replace lytes as needed Strict I&O Trend daily BUN/creatinine along with urine output  GASTROINTESTINAL A:  S/P Ex Lap POD #10 Superior Mesenteric Artery Thrombus C diff Colonization vs Infection w/ Diarrhea Abdominal Wall Hematoma   -on tube feeds with loose tube feed stools and ongoing c diff Rx  P:    pepcid No laxatives Tube feeds per dietary See ID Section   HEMATOLOGIC A:   Leukocytosis - Likely reactive Anemia - due to abdominal wall hematoma  P:  Holding heparin gtt due to hematoma - resttart timing depends on goals of care Monitoring daily hemoglobin with CBC Threshold for transfusion PRBC Hgb <8.0 or hypotension w/ blood loss SCDs for DVT prophylaxis  INFECTIOUS MRSA PCR 8/25 - NEGATIVE C Diff PCR 8/29 - POSITIVE C Diff Antigen 8/29 - POSITIVE,  C Diff Toxin 06/10/15 - NEGATIVE ..............Marland Kitchen Repeat sputum 9/4>>> neg BC 9/4>>> Pseudomonas UC 9/4>>>Pseudomonas     A:   # C diff Colonization vs Infection - diagnosed 06/10/15 at time of fever spike  to 100.97F but WBC had actually reduced to 17 ->  12.5K  - fever responded to C Diff Rx so suspect true infection   #REcurent fever 103F on 06/16/15 - initially considerd as due to abd wall hematomoa but confirmed due to Pseudomonas bacteremia/septicemia  and UTI   - improved fevers 06-28-15    P:   #For C Diff   - Vanco PO 8/30>>   - Flagyl 8/30>>  #For others and 06/16/15 pseudomonas 8/25 zoysn>>8/25 8/25 zinacef>>8/26 ............. 9/5 IV Vanc >>9/7 9/5 IV zosyn >.9/7, Ceftaz 9/7 (narrowing abx) >>       ENDOCRINE A:   H/O DM Hypoglycemia to 68 on 9/2  P:   Sliding-scale insulin per algorithm Accu-Checks every 4 hours  NEUROLOGIC A:   H/O Dementia-->superimposed acute encephalopathy  Postoperative abdominal pain   - tenderness abd improved. Agitated delirium + on WUA  P:   RASS goal: 0 to -1 Cont precedex  FAMILY  - Updates: Family meeting held 9/1 with Dr. Hart Rochester present. Family updated regarding her current clinical state and failure to wean from ventilator.  - Inter-disciplinary family meet or Palliative Care meeting:  9/1 w/ Dr. Lysbeth Galas, Benita, & other family members. 06/17/15 done by Anders Simmonds - APP with family - possible one way extubation 2015-06-28 . Per nursing and other family report - family gathering again 2015-06-28 at 9.30am - will address     The patient is critically ill with multiple organ systems failure and requires high complexity decision making for assessment and support, frequent evaluation and titration of therapies, application of advanced monitoring technologies and extensive interpretation of multiple databases.   Critical Care Time devoted to patient care services described in this note is  40  Minutes. This time reflects time of care of this signee Dr Kalman Shan. This critical care time does not reflect procedure time, or teaching time or supervisory time of PA/NP/Med student/Med Resident etc but could involve care discussion  time    Dr. Kalman Shan, M.D., [facility].C.P Pulmonary and Critical Care Medicine Staff Physician [facility] [place] Pulmonary and Critical Care Pager: [phone], If no answer or between  15:00h - 7:00h: call [phone]  06/28/2015 8:44 AM

## 2015-06-19 NOTE — Progress Notes (Signed)
eLink Physician-Brief Progress Note Patient Name: Jaquelinne Glendening DOB: 1928-10-08 MRN: [id number]   Date of Service  06/13/2015  HPI/Events of Note  Patient on max precedex and using 25 mcg fentanyl q3 hours.  Patient is agitated, bucking the vent and tachycardic.    eICU Interventions  Plan: Increase fentanyl to 25 to 75 mcg q2 hours prn     Intervention Category Major Interventions: Delirium, psychosis, severe agitation - evaluation and management  Saoirse Legere , 2:50 AM

## 2015-06-19 NOTE — Progress Notes (Signed)
Nutrition Follow Up/Brief Note  Chart reviewed. Pt now transitioning to comfort care; for terminal wean. No further nutrition interventions warranted at this time.  Please re-consult as needed.   Maureen Chatters, RD, LDN Pager #: [phone] After-Hours Pager #: [phone]

## 2015-06-20 LAB — CULTURE, BLOOD (ROUTINE X 2)

## 2015-07-13 NOTE — Progress Notes (Addendum)
Pt without spontanous chest rise. No audible heart tones ausculitated x 2 minutes. Heart Rhythm noted as asystole. Assessment verified by 2nd RN Julian Hy. Patient time of death occurred at 02/14/13.   Amber Hood

## 2015-07-13 NOTE — Progress Notes (Addendum)
95cc fentanyl gtt wasted in sink. 17cc versed wasted in sink. All witnessed by 2nd RN Marthann Schiller.

## 2015-07-13 NOTE — Care Management Note (Signed)
Case Management Note  Patient Details  Name: Amber Hood MRN: [id number] Date of Birth: [birth year]  Subjective/Objective:                    Action/Plan:   Expected Discharge Date:                  Expected Discharge Plan:  Skilled Nursing Facility  In-House Referral:  Clinical Social Work  Discharge planning Services  CM Consult  Post Acute Care Choice:    Choice offered to:     DME Arranged:    DME Agency:     HH Arranged:    HH Agency:     Status of Service:  Completed, signed off  Medicare Important Message Given:  Yes-third notification given Date Medicare IM Given:    Medicare IM give by:    Date Additional Medicare IM Given:    Additional Medicare Important Message give by:     If discussed at Long Length of Stay Meetings, dates discussed:    Additional Comments:  Vangie Bicker, RN , 9:08 AM

## 2015-07-13 NOTE — Discharge Summary (Signed)
[facility] Death Summary  Amber Hood 11-17-1927 81 y.o. female  [id number]  Admission Date: 2015-06-29  Date of Death: 07-12-2015  Physician: Josephina Gip, MD  Admission Diagnosis: Thrombectomy of mesenteric artery mesteric eschemia secondary to sma thrombosis  HPI:   This is a 81 y.o. female who presented for evaluation of abdominal pain on 06/29/15. Patient was evaluated at [facility] for acute onset of abdominal pain at approximately 4 PM. 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 Patient has a long history of intermittent atrial fibrillation. She is not on chronic anticoagulation. She was transferred here for attempted thrombectomy of superior mesenteric artery. She has had no previous emboli. She has had previous abdominal procedures.  Hospital Course:  The patient was admitted to the hospital and taken to the operating room on 06-29-2015 and underwent: exploratory laparotomy and thromboembolectomy of superior mesenteric artery.     The patient tolerated the procedure well and was transported to the ICU in stable condition on the ventilator.   POD 1: The patient remained on the ventilator. Cardiology was consulted for atrial fibrillation with RVR. She was started on a heparin drip. She was believed not to be a candidate for anticoagulation due to history of dementia and falls.   She was started on oral vancomycin and flagyl for c. difficile infection.   The patient was unable to be weaned off of the ventilator during the remainder of her hospital course. There was an interdisciplinary meeting with the family who ultimately decided on terminal wean. The patient was extubated on July 12, 2015 and passed later that day.    CBC    Component Value Date/Time   WBC 32.0* 12-Jul-2015 0743   RBC 3.03* 2015-07-12 0743   HGB 9.2* 12-Jul-2015 0743   HCT 27.4* 07/12/2015 0743   PLT 223 July 12, 2015 0743   MCV 90.4 07-12-2015 0743     MCH 30.4 07-12-15 0743   MCHC 33.6 July 12, 2015 0743   RDW 15.0 Jul 12, 2015 0743   LYMPHSABS 1.3 2015/07/12 0743   MONOABS 1.3* 07-12-2015 0743   EOSABS 0.0 2015-07-12 0743   BASOSABS 0.0 12-Jul-2015 0743    BMET    Component Value Date/Time   NA 143 July 12, 2015 0743   K 3.3* 2015/07/12 0743   CL 111 07-12-15 0743   CO2 21* 07/12/2015 0743   GLUCOSE 193* July 12, 2015 0743   BUN 57* July 12, 2015 0743   CREATININE 1.49* 07-12-2015 0743   CALCIUM 8.6* July 12, 2015 0743   GFRNONAA 31* 12-Jul-2015 0743   GFRAA 36* 2015-07-12 0743       Discharge Diagnosis:  Thrombectomy of mesenteric artery mesteric eschemia secondary to sma thrombosis  Secondary Diagnosis: Patient Active Problem List   Diagnosis Date Noted  . Bacteremia due to Pseudomonas 12-Jul-2015  . Pseudomonas septicemia 07/12/2015  . UTI (lower urinary tract infection) 07-12-2015  . Encephalopathy acute 2015-07-12  . DNAR (do not attempt resuscitation) July 12, 2015  . Palliative care status 07/12/15  . Hypoxia   . Pneumonia   . Respiratory failure   . Respiratory failure requiring intubation   . Acute mesenteric ischemia Jun 29, 2015  . Shock circulatory   . Atrial fibrillation with RVR   . Ventilator dependence   . DM II (diabetes mellitus, type II), controlled 05/23/2012  . HTN (hypertension) 05/23/2012  . Hyperlipidemia 05/23/2012  . Insomnia 05/23/2012  . Goiter 05/23/2012   Past Medical History  Diagnosis Date  . Diabetes mellitus   . Hypertension   . Hyperlipidemia   .  Thyroid disease   . Dementia        Medication List    ASK your doctor about these medications        amLODipine 5 MG tablet  Commonly known as:  NORVASC  Take 5 mg by mouth daily.     aspirin 81 MG chewable tablet  Chew 81 mg by mouth daily.     atorvastatin 10 MG tablet  Commonly known as:  LIPITOR  Take 10 mg by mouth daily at 6 PM.     atorvastatin 10 MG tablet  Commonly known as:  LIPITOR  Take 10 mg by mouth daily.      cetirizine 10 MG tablet  Commonly known as:  ZYRTEC  Take 1 tablet (10 mg total) by mouth daily as needed.     cholecalciferol 1000 UNITS tablet  Commonly known as:  VITAMIN D  Take 1,000 Units by mouth daily.     glimepiride 1 MG tablet  Commonly known as:  AMARYL  Take 1 mg by mouth every morning.     hydrochlorothiazide 25 MG tablet  Commonly known as:  HYDRODIURIL  Take 1 tablet (25 mg total) by mouth daily.     lamoTRIgine 100 MG tablet  Commonly known as:  LAMICTAL  Take 100 mg by mouth daily.     lisinopril 40 MG tablet  Commonly known as:  PRINIVIL,ZESTRIL  Take 1 tablet (40 mg total) by mouth daily.     memantine 10 MG tablet  Commonly known as:  NAMENDA  Take 10 mg by mouth 2 (two) times daily.     metFORMIN 500 MG tablet  Commonly known as:  GLUCOPHAGE  Take 500 mg by mouth daily.     mirtazapine 15 MG tablet  Commonly known as:  REMERON  Take 15 mg by mouth at bedtime.     polyethylene glycol packet  Commonly known as:  MIRALAX / GLYCOLAX  Take 17 g by mouth daily as needed.     TOPROL XL 25 MG 24 hr tablet  Generic drug:  metoprolol succinate  Take 25 mg by mouth daily.        Patient's condition: is deceased.     Maris Berger, PA-C [facility] [phone] 06/27/2015  7:22 AM

## 2015-07-13 DEATH — deceased

## 2017-01-18 IMAGING — CR DG CHEST 1V PORT
2 series · 2 of 2 positions shown · non-contrast
Comparison: None.

CLINICAL DATA: Preop.  Ischemic bowel.

EXAM:
PORTABLE CHEST - 1 VIEW

[AP (1 of 2)]
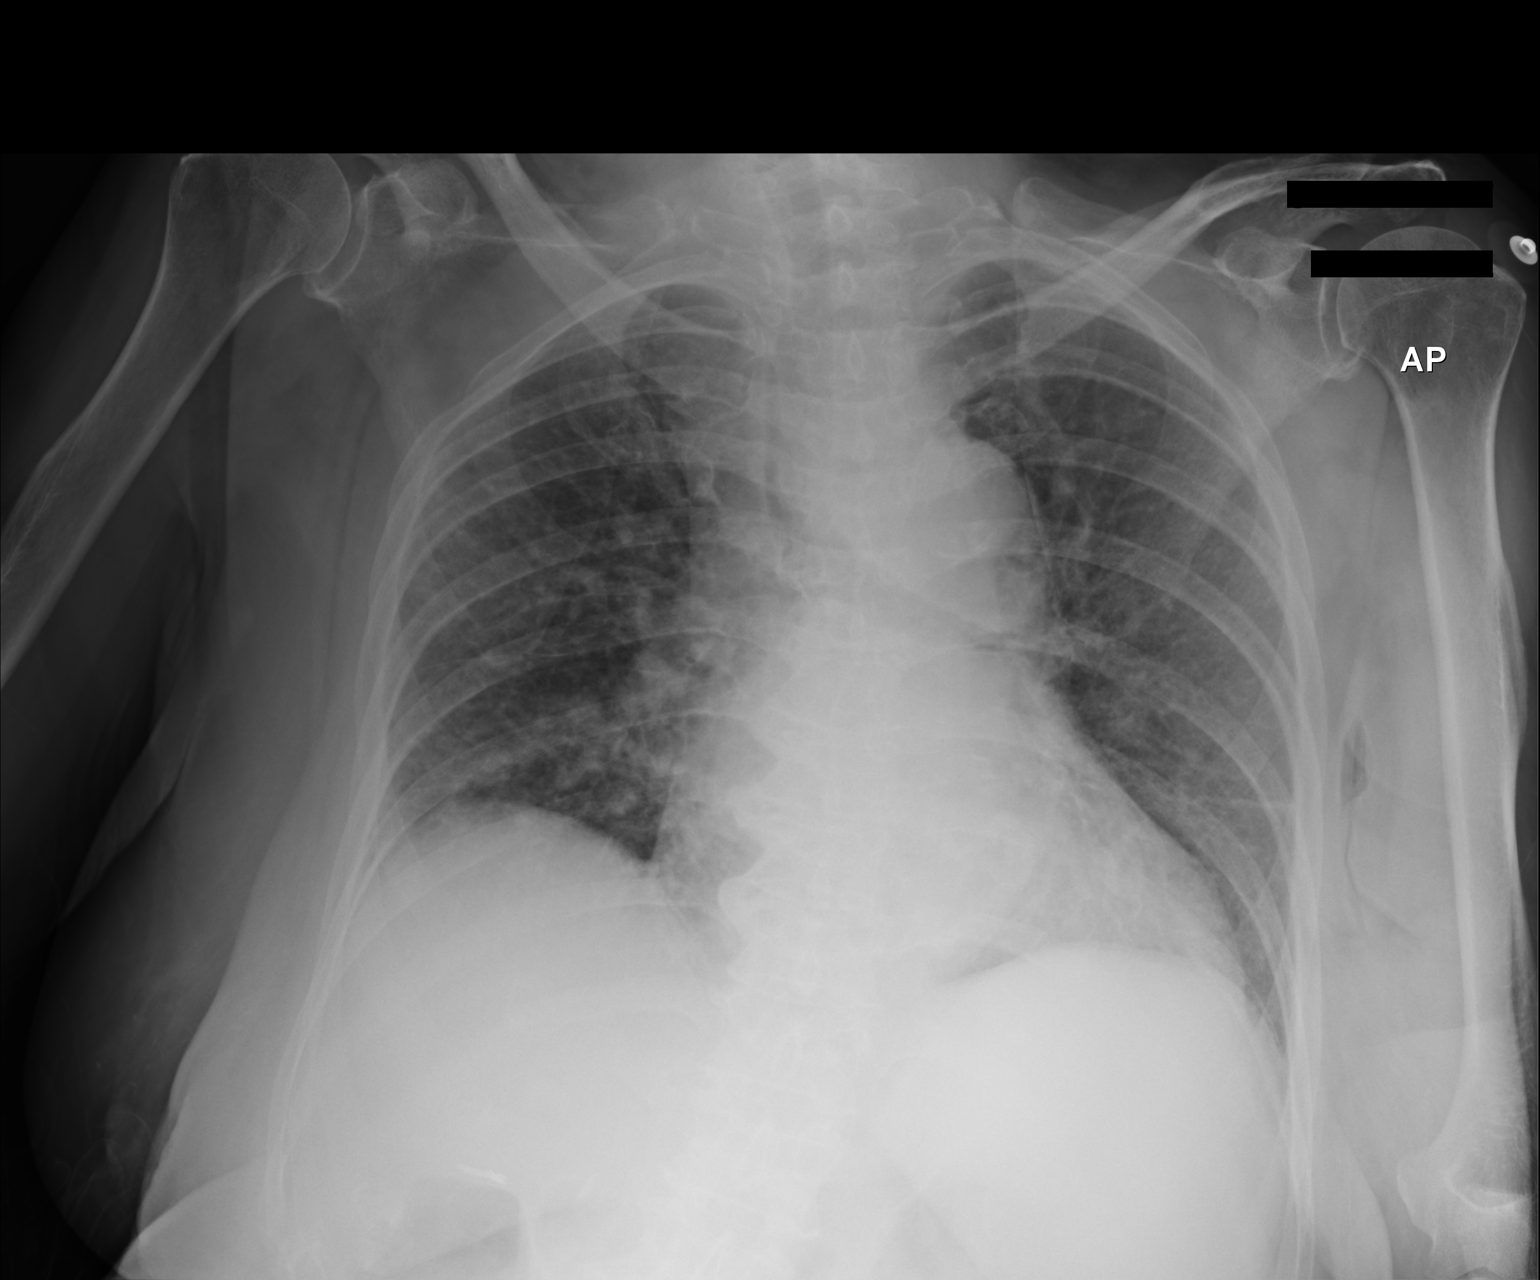

[AP (2 of 2)]
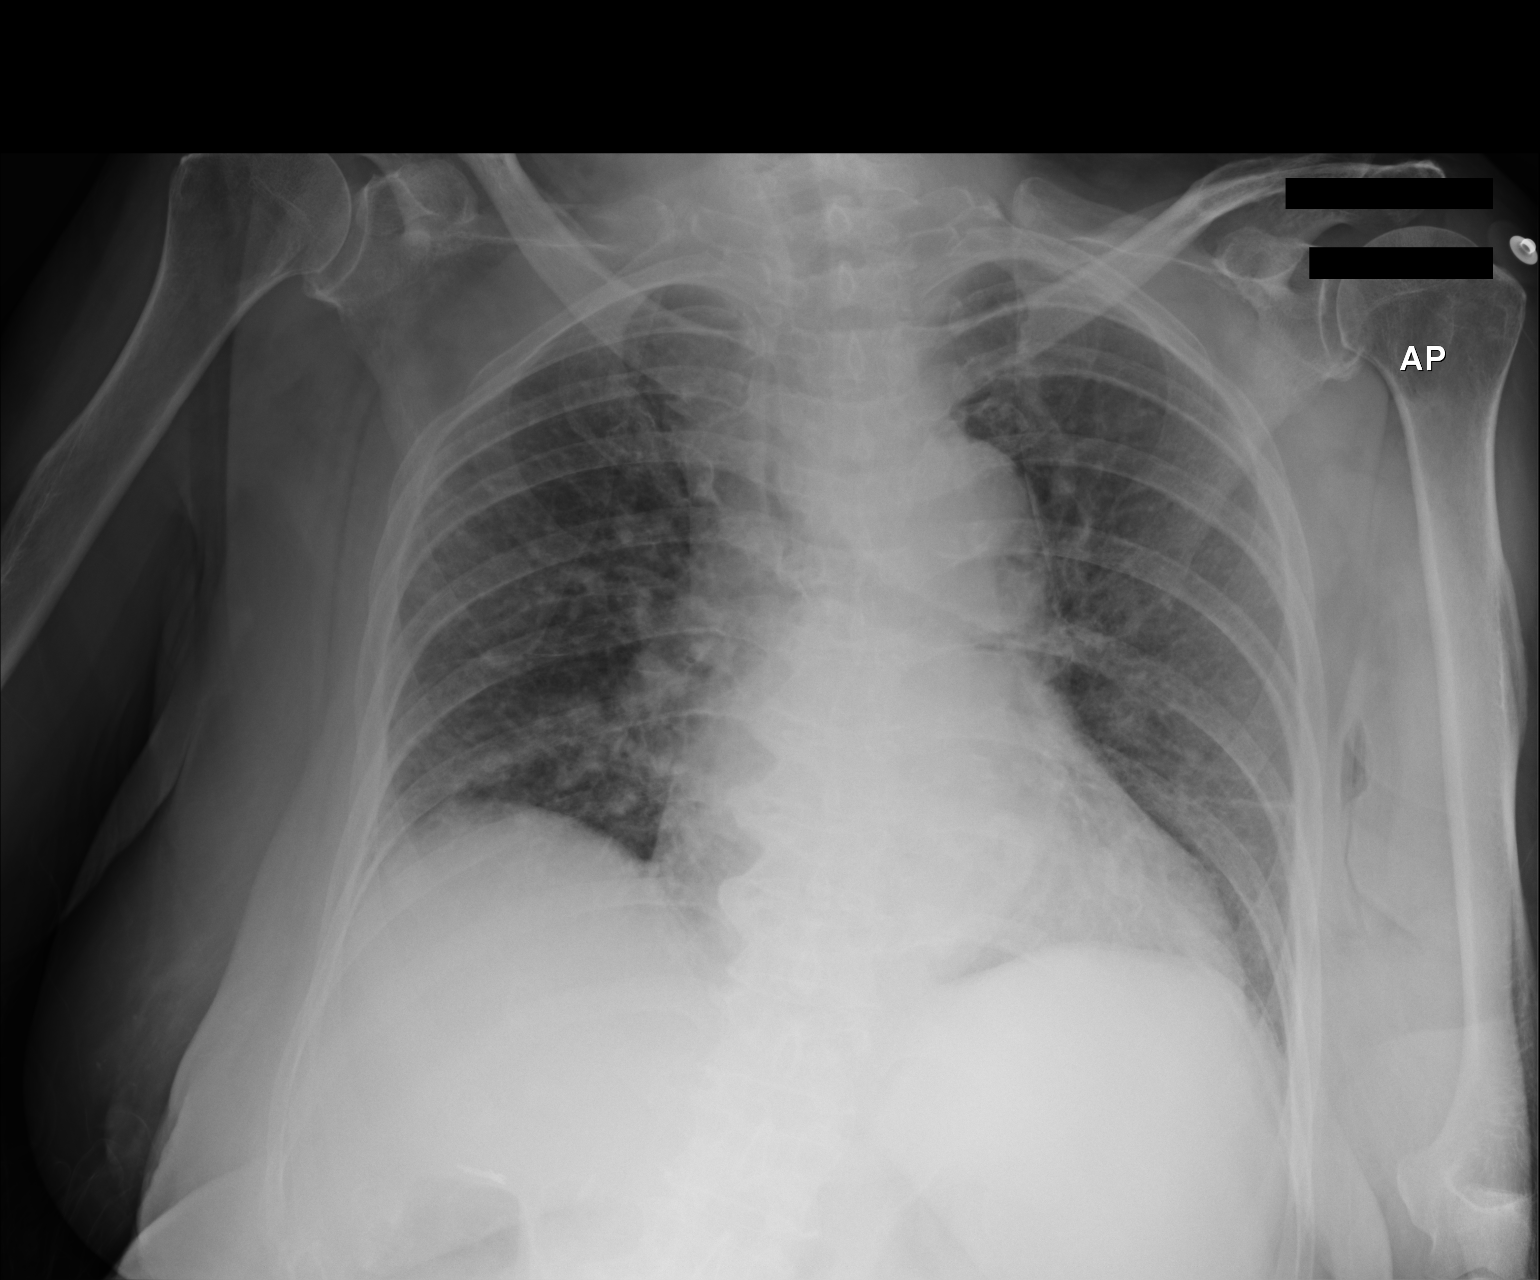

[2 of 2 positions shown; findings below may reference images not displayed]

FINDINGS: Shallow inspiration. Borderline heart size and pulmonary
vascularity, likely normal for technique. No focal consolidation in
the lungs. Calcified and tortuous aorta. No blunting of costophrenic
angles. No pneumothorax. Degenerative changes in the spine.
IMPRESSION: No active disease.

## 2017-01-19 IMAGING — CR DG CHEST 1V PORT
1 series · 1 of 1 positions shown · non-contrast
Comparison: Portable chest x-ray June 06, 2015

CLINICAL DATA: Postoperative examination, history of new acute
mesenteric ischemia, intubated patient.

EXAM:
PORTABLE CHEST - 1 VIEW

[AP]
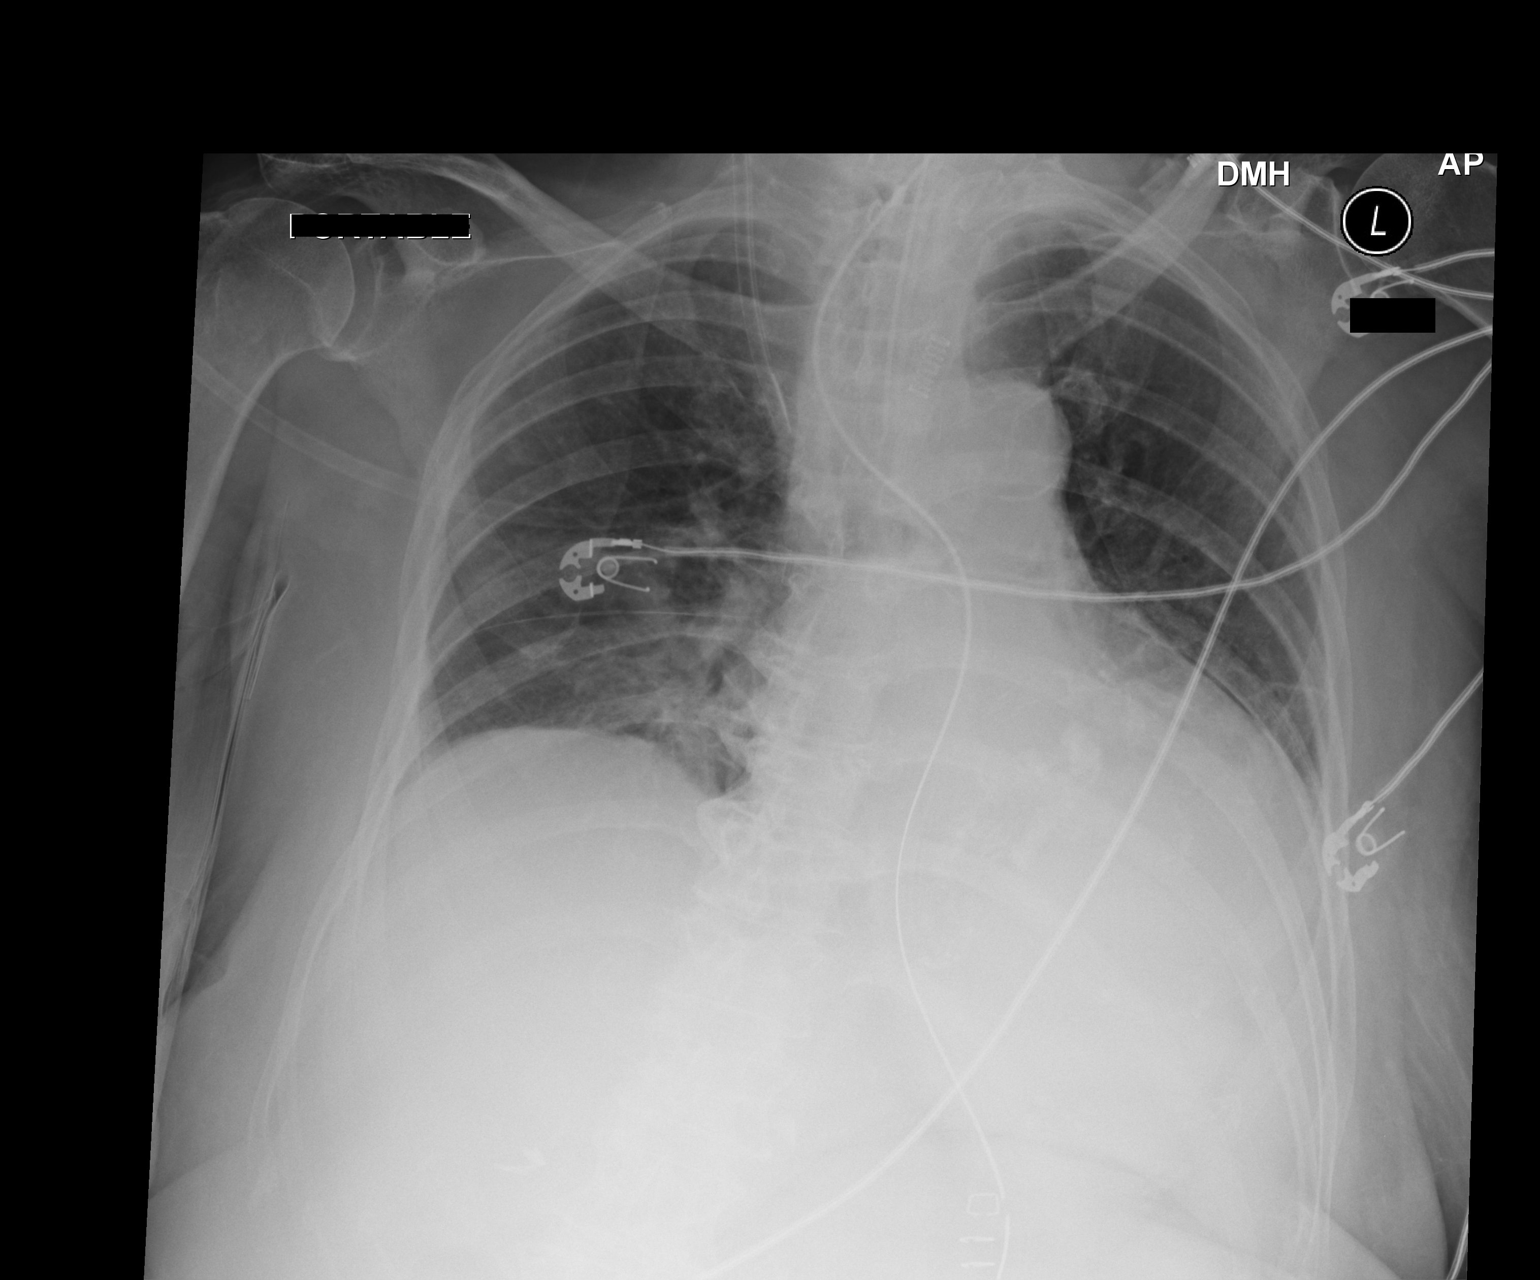

[1 of 1 positions shown; findings below may reference images not displayed]

FINDINGS: The lungs are borderline hypoinflated. There is subsegmental
atelectasis in the right infrahilar region. The left lower lobe is
more dense today and there is new obscuration of the left
hemidiaphragm. The cardiac silhouette is mildly enlarged. The
pulmonary vascularity is not engorged. The endotracheal tube tip
lies 3.2 cm above the carina. The esophagogastric tube tip and
proximal port lie below the GE junction. The right internal jugular
Cordis sheath tip projects at the junction of the right and left
brachiocephalic veins.
IMPRESSION: Interval development of bibasilar atelectasis. There is no pulmonary
edema nor significant pleural effusion. The support tubes are in
reasonable position.

## 2017-01-20 IMAGING — CR DG CHEST 1V PORT
1 series · 1 of 1 positions shown · non-contrast
Comparison: 06/07/2015

CLINICAL DATA: Respiratory failure requiring intubation
([id number])

EXAM:
PORTABLE CHEST - 1 VIEW

[AP]
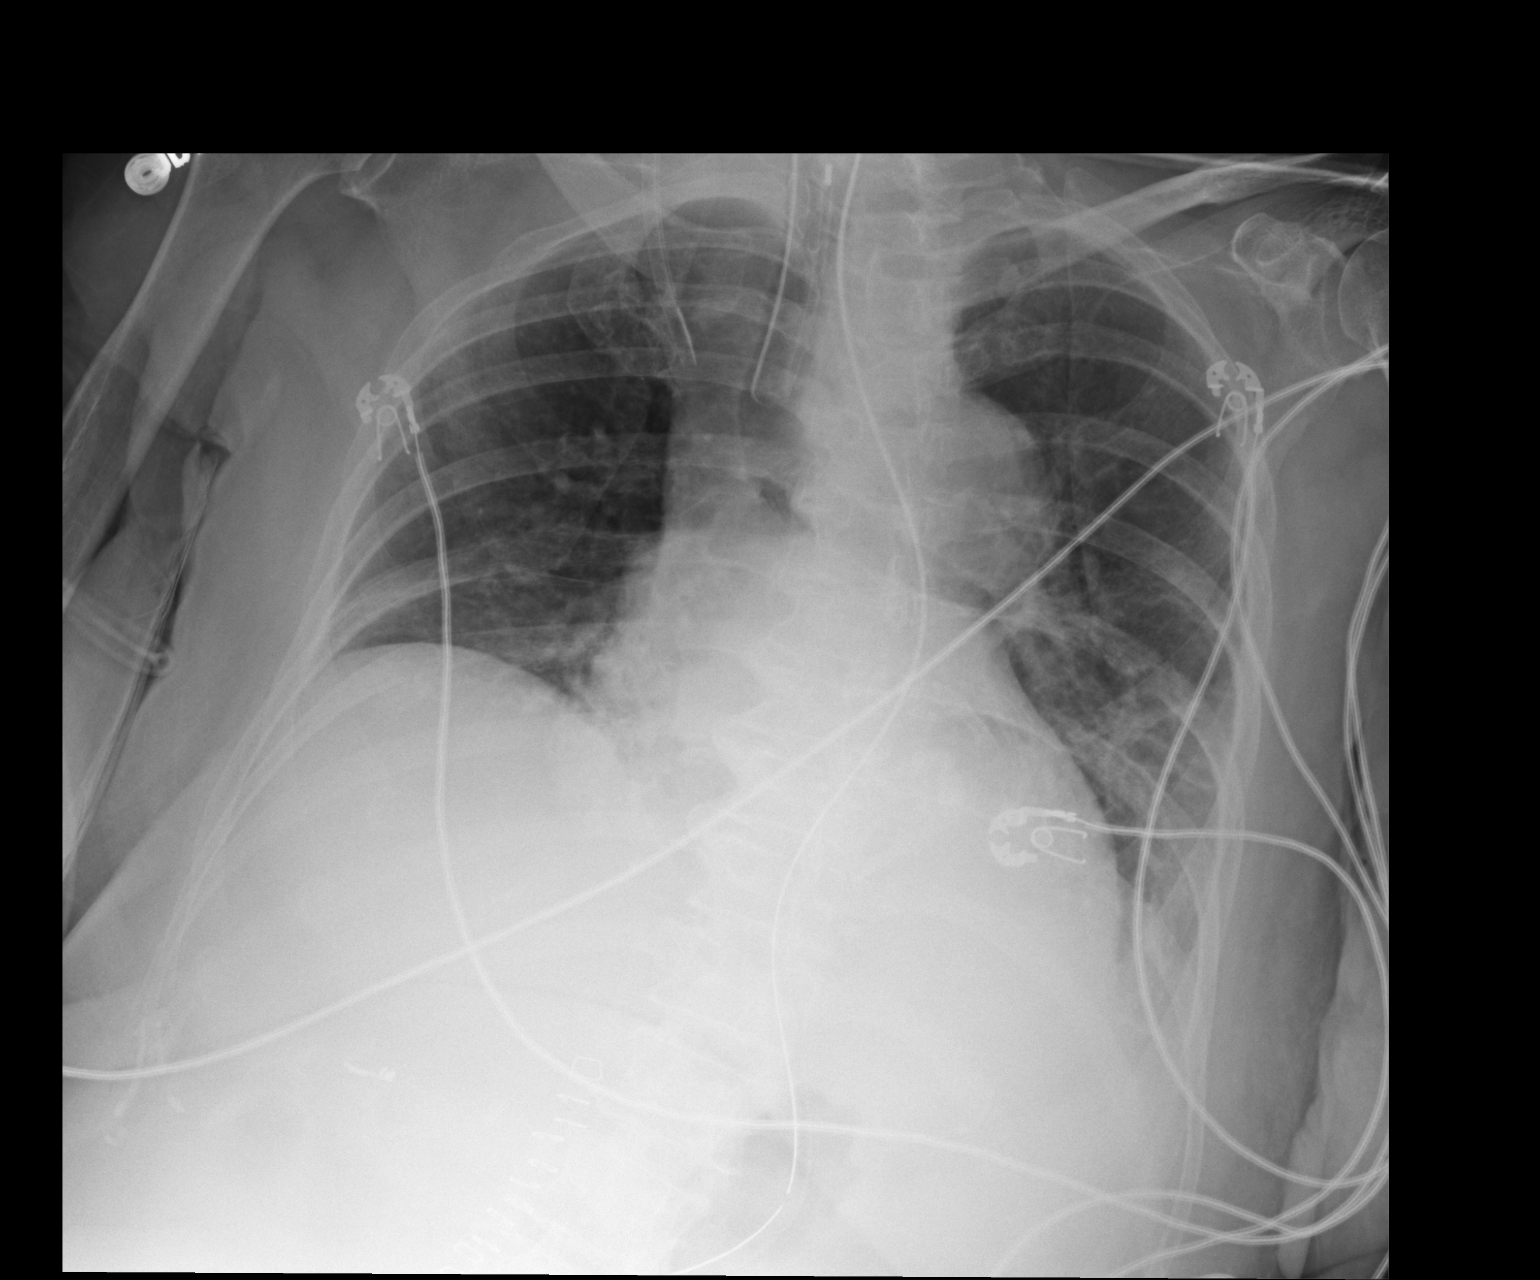

[1 of 1 positions shown; findings below may reference images not displayed]

FINDINGS: Lung base opacity appears mildly improved the previous day's study.
Residual opacity remains consistent with atelectasis likely with
small effusions. No convincing pneumonia. No pulmonary edema.

No pneumothorax.  Cardiac silhouette is top-normal in size.

Endotracheal tube is stable, tip projecting 2.8 cm above the carina.
Right internal jugular introducer sheath and orogastric tube are
also stable and well positioned.
IMPRESSION: 1. Mild improvement in lung base atelectasis, likely associated with
small effusions.
2. No evidence of pneumonia or edema.  No pneumothorax.
3. Support apparatus remains stable and well positioned.
